# Patient Record
Sex: Male | Born: 1985 | Race: White | Hispanic: No | Marital: Married | Smoking: Never smoker
Health system: Southern US, Community
[De-identification: ages and names within clinical notes are randomized; demographics above are authoritative.]

## PROBLEM LIST (undated history)

## (undated) DIAGNOSIS — I10 Essential (primary) hypertension: Secondary | ICD-10-CM

## (undated) DIAGNOSIS — R112 Nausea with vomiting, unspecified: Secondary | ICD-10-CM

## (undated) DIAGNOSIS — T8859XA Other complications of anesthesia, initial encounter: Secondary | ICD-10-CM

## (undated) DIAGNOSIS — Z9889 Other specified postprocedural states: Secondary | ICD-10-CM

## (undated) DIAGNOSIS — K219 Gastro-esophageal reflux disease without esophagitis: Secondary | ICD-10-CM

## (undated) DIAGNOSIS — R519 Headache, unspecified: Secondary | ICD-10-CM

## (undated) HISTORY — PX: TESTICLE TORSION REDUCTION: SHX795

## (undated) HISTORY — DX: Headache, unspecified: R51.9

## (undated) HISTORY — PX: OTHER SURGICAL HISTORY: SHX169

---
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
## 2012-12-30 ENCOUNTER — Encounter (HOSPITAL_COMMUNITY): Payer: Self-pay | Admitting: *Deleted

## 2012-12-30 ENCOUNTER — Emergency Department (HOSPITAL_COMMUNITY)
Admission: EM | Admit: 2012-12-30 | Discharge: 2012-12-30 | Disposition: A | Discharge status: Home / Self Care | Payer: Worker's Compensation | Attending: Emergency Medicine | Admitting: Emergency Medicine

## 2012-12-30 DIAGNOSIS — S0180XA Unspecified open wound of other part of head, initial encounter: Secondary | ICD-10-CM | POA: Insufficient documentation

## 2012-12-30 DIAGNOSIS — IMO0002 Reserved for concepts with insufficient information to code with codable children: Secondary | ICD-10-CM | POA: Insufficient documentation

## 2012-12-30 DIAGNOSIS — S80211A Abrasion, right knee, initial encounter: Secondary | ICD-10-CM

## 2012-12-30 DIAGNOSIS — S80212A Abrasion, left knee, initial encounter: Secondary | ICD-10-CM

## 2012-12-30 DIAGNOSIS — S0003XA Contusion of scalp, initial encounter: Secondary | ICD-10-CM | POA: Insufficient documentation

## 2012-12-30 DIAGNOSIS — Y9289 Other specified places as the place of occurrence of the external cause: Secondary | ICD-10-CM | POA: Insufficient documentation

## 2012-12-30 DIAGNOSIS — W19XXXA Unspecified fall, initial encounter: Secondary | ICD-10-CM | POA: Insufficient documentation

## 2012-12-30 DIAGNOSIS — R55 Syncope and collapse: Secondary | ICD-10-CM | POA: Insufficient documentation

## 2012-12-30 DIAGNOSIS — S0093XA Contusion of unspecified part of head, initial encounter: Secondary | ICD-10-CM

## 2012-12-30 DIAGNOSIS — S0181XA Laceration without foreign body of other part of head, initial encounter: Secondary | ICD-10-CM

## 2012-12-30 DIAGNOSIS — Y990 Civilian activity done for income or pay: Secondary | ICD-10-CM | POA: Insufficient documentation

## 2012-12-30 DIAGNOSIS — Y9372 Activity, wrestling: Secondary | ICD-10-CM | POA: Insufficient documentation

## 2012-12-30 MED ORDER — LIDOCAINE-EPINEPHRINE-TETRACAINE (LET) SOLUTION
3.0000 mL | Freq: Once | NASAL | Status: DC
  Filled 2012-12-30: qty 3

## 2012-12-30 MED ORDER — ACETAMINOPHEN 500 MG PO TABS
1000.0000 mg | ORAL_TABLET | Freq: Once | ORAL | Status: AC
  Administered 2012-12-30: 1000 mg via ORAL
  Filled 2012-12-30: qty 2
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
## 2016-10-01 ENCOUNTER — Emergency Department (HOSPITAL_COMMUNITY): Payer: No Typology Code available for payment source

## 2016-10-01 ENCOUNTER — Encounter (HOSPITAL_COMMUNITY): Payer: Self-pay | Admitting: Emergency Medicine

## 2016-10-01 ENCOUNTER — Emergency Department (HOSPITAL_COMMUNITY)
Admission: EM | Admit: 2016-10-01 | Discharge: 2016-10-01 | Disposition: A | Discharge status: Home / Self Care | Payer: No Typology Code available for payment source | Attending: Emergency Medicine | Admitting: Emergency Medicine

## 2016-10-01 DIAGNOSIS — M545 Low back pain, unspecified: Secondary | ICD-10-CM

## 2016-10-01 DIAGNOSIS — S3992XA Unspecified injury of lower back, initial encounter: Secondary | ICD-10-CM | POA: Diagnosis present

## 2016-10-01 DIAGNOSIS — I10 Essential (primary) hypertension: Secondary | ICD-10-CM | POA: Insufficient documentation

## 2016-10-01 DIAGNOSIS — Y9389 Activity, other specified: Secondary | ICD-10-CM | POA: Diagnosis not present

## 2016-10-01 DIAGNOSIS — Y92410 Unspecified street and highway as the place of occurrence of the external cause: Secondary | ICD-10-CM | POA: Insufficient documentation

## 2016-10-01 DIAGNOSIS — Y999 Unspecified external cause status: Secondary | ICD-10-CM | POA: Insufficient documentation

## 2016-10-01 HISTORY — DX: Essential (primary) hypertension: I10

## 2016-10-01 HISTORY — DX: Gastro-esophageal reflux disease without esophagitis: K21.9

## 2016-10-01 MED ORDER — CYCLOBENZAPRINE HCL 10 MG PO TABS: 10.0000 mg | ORAL_TABLET | Freq: Two times a day (BID) | ORAL | 0 refills | Status: DC | PRN

## 2016-10-01 MED ORDER — NAPROXEN 500 MG PO TABS: 500.0000 mg | ORAL_TABLET | Freq: Two times a day (BID) | ORAL | 0 refills | Status: DC

## 2016-10-01 NOTE — Discharge Instructions (Signed)
No fracture on x-ray. You will be sore for several days. Alternate cold and hot back. Prescription for muscle relaxer and anti-inflammatory medication.
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
## 2019-12-25 ENCOUNTER — Ambulatory Visit (INDEPENDENT_AMBULATORY_CARE_PROVIDER_SITE_OTHER): Payer: Commercial Managed Care - PPO

## 2019-12-25 ENCOUNTER — Other Ambulatory Visit: Payer: Self-pay

## 2019-12-25 ENCOUNTER — Ambulatory Visit
Admission: EM | Admit: 2019-12-25 | Discharge: 2019-12-25 | Disposition: A | Discharge status: Home / Self Care | Payer: Commercial Managed Care - PPO | Attending: Emergency Medicine | Admitting: Emergency Medicine

## 2019-12-25 DIAGNOSIS — S6992XA Unspecified injury of left wrist, hand and finger(s), initial encounter: Secondary | ICD-10-CM

## 2019-12-25 DIAGNOSIS — M25532 Pain in left wrist: Secondary | ICD-10-CM | POA: Diagnosis not present

## 2019-12-25 MED ORDER — NAPROXEN 500 MG PO TABS: 500.0000 mg | ORAL_TABLET | Freq: Two times a day (BID) | ORAL | 0 refills | Status: DC

## 2019-12-25 NOTE — ED Provider Notes (Signed)
[facility]   [id number] 12/25/19 Arrival Time: 1038  CC: LT wrist PAIN  SUBJECTIVE: History from: patient. Tony Stephens is a 34 y.o. male complains of LT wrist pain and injury x 1 day.  FOOSH.  Localizes the pain to the lateral wrist.  Describes the pain as intermittent and throbbing in character.  Has tried OTC medications without relief.  Symptoms are made worse with ROM.  Reports previous fracture LT forearm.  Complains of associates swelling and slight bruising.  Denies fever, chills, erythema, ecchymosis, numbness and tingling  ROS: As per HPI.  All other pertinent ROS negative.     Past Medical History:  Diagnosis Date  . GERD (gastroesophageal reflux disease)   . Hypertension    Past Surgical History:  Procedure Laterality Date  . arm surgery Left   . TESTICLE TORSION REDUCTION     No Known Allergies No current facility-administered medications on file prior to encounter.   Current Outpatient Medications on File Prior to Encounter  Medication Sig Dispense Refill  . lansoprazole (PREVACID) 30 MG capsule Take 30 mg by mouth daily at 12 noon.     Social History   Socioeconomic History  . Marital status: Single    Spouse name: Not on file  . Number of children: Not on file  . Years of education: Not on file  . Highest education level: Not on file  Occupational History  . Not on file  Tobacco Use  . Smoking status: Never Smoker  . Smokeless tobacco: Never Used  Substance and Sexual Activity  . Alcohol use: No  . Drug use: No  . Sexual activity: Not on file  Other Topics Concern  . Not on file  Social History Narrative  . Not on file   Social Determinants of Health   Financial Resource Strain:   . Difficulty of Paying Living Expenses:   Food Insecurity:   . Worried About Programme researcher, broadcasting/film/video in the Last Year:   . Barista in the Last Year:   Transportation Needs:   . Freight forwarder (Medical):   Marland Kitchen Lack of Transportation  (Non-Medical):   Physical Activity:   . Days of Exercise per Week:   . Minutes of Exercise per Session:   Stress:   . Feeling of Stress :   Social Connections:   . Frequency of Communication with Friends and Family:   . Frequency of Social Gatherings with Friends and Family:   . Attends Religious Services:   . Active Member of Clubs or Organizations:   . Attends Banker Meetings:   Marland Kitchen Marital Status:   Intimate Partner Violence:   . Fear of Current or Ex-Partner:   . Emotionally Abused:   Marland Kitchen Physically Abused:   . Sexually Abused:    Family History  Problem Relation Age of Onset  . Diabetes Father   . Hypertension Father     OBJECTIVE:  Vitals:   12/25/19 1050  BP: (!) 148/84  Pulse: 67  Resp: 18  Temp: 98 F (36.7 C)  SpO2: 97%    General appearance: ALERT; in no acute distress.  Head: NCAT Lungs: Normal respiratory effort CV: Radial pulse 2+ . Cap refill < 2 seconds Musculoskeletal: LT wrist  Inspection: swelling over lateral wrist, light ecchymosis distal dorsal aspect of hand Palpation: TTP over lateral anterior wrist; snuff box tenderness ROM: LROM about the wrist Strength: deferred Skin: warm and dry Neurologic: Ambulates without difficulty; Sensation intact about  the upper extremities Psychological: alert and cooperative; normal mood and affect  DIAGNOSTIC STUDIES:  DG Wrist Complete Left  Result Date: 12/25/2019 CLINICAL DATA:  Left wrist injury last night while playing basketball. EXAM: LEFT WRIST - COMPLETE 3+ VIEW COMPARISON:  6/12/5 FINDINGS: Previous plate and screw fixation of radius and ulna. No acute fractures or dislocations identified. Soft tissues are unremarkable. No significant arthropathy. IMPRESSION: Negative. Electronically Signed   By: Kerby Moors M.D.   On: 12/25/2019 11:14     X-rays negative for bony abnormalities including fracture, or dislocation.  Hardware present  I have reviewed the x-rays myself and the  radiologist interpretation. I am in agreement with the radiologist interpretation.     ASSESSMENT & PLAN:  1. Left wrist pain   2. Injury of left wrist, initial encounter     Meds ordered this encounter  Medications  . naproxen (NAPROSYN) 500 MG tablet    Sig: Take 1 tablet (500 mg total) by mouth 2 (two) times daily.    Dispense:  30 tablet    Refill:  0    Order Specific Question:   Supervising Provider    Answer:   Raylene Everts [[id number]]   X-rays negative for fracture or dislocation However, due to snuff box tenderness, thumb spica brace given Follow up here or with PCP or ortho for repeat x-rays Continue conservative management of rest, ice, and elevation Take naproxen as needed for pain relief (may cause abdominal discomfort, ulcers, and GI bleeds avoid taking with other NSAIDs) Return or go to the ER if you have any new or worsening symptoms (fever, chills, chest pain, redness, swelling, deformity, bruising, worsening symptoms despite treatment, etc...)    Reviewed expectations re: course of current medical issues. Questions answered. Outlined signs and symptoms indicating need for more acute intervention. Patient verbalized understanding. After Visit Summary given.    Lestine Box, PA-C 12/25/19 1120

## 2019-12-25 NOTE — ED Triage Notes (Signed)
Pt injured left wrist last night playing basketball, previous hardware placement

## 2019-12-25 NOTE — Discharge Instructions (Addendum)
X-rays negative for fracture or dislocation However, due to snuff box tenderness, thumb spica brace given Follow up here or with PCP or ortho for repeat x-rays Continue conservative management of rest, ice, and elevation Take naproxen as needed for pain relief (may cause abdominal discomfort, ulcers, and GI bleeds avoid taking with other NSAIDs) Return or go to the ER if you have any new or worsening symptoms (fever, chills, chest pain, redness, swelling, deformity, bruising, worsening symptoms despite treatment, etc...)

## 2021-08-09 ENCOUNTER — Encounter: Payer: Self-pay | Admitting: Psychiatry

## 2021-08-09 ENCOUNTER — Other Ambulatory Visit: Payer: Self-pay

## 2021-08-09 ENCOUNTER — Ambulatory Visit (INDEPENDENT_AMBULATORY_CARE_PROVIDER_SITE_OTHER): Payer: PRIVATE HEALTH INSURANCE | Admitting: Psychiatry

## 2021-08-09 VITALS — BP 135/88 | HR 62 | Ht 68.0 in | Wt 157.0 lb

## 2021-08-09 DIAGNOSIS — G44209 Tension-type headache, unspecified, not intractable: Secondary | ICD-10-CM

## 2021-08-09 MED ORDER — TOPIRAMATE 25 MG PO TABS: ORAL_TABLET | ORAL | 3 refills | Status: DC

## 2021-08-09 MED ORDER — METHOCARBAMOL 500 MG PO TABS: 500.0000 mg | ORAL_TABLET | Freq: Three times a day (TID) | ORAL | 0 refills | Status: DC | PRN

## 2021-08-09 MED ORDER — METHYLPREDNISOLONE 4 MG PO TBPK: ORAL_TABLET | ORAL | 0 refills | Status: DC

## 2021-08-09 NOTE — Patient Instructions (Addendum)
Increase Topamax to 25 in the morning and 50 mg at night for one week. Then increase to 50 mg in the morning and 50 mg at night. Steroid pack to help break current headache Take Robaxin up to three times a day as needed for neck tension and pain

## 2021-08-09 NOTE — Progress Notes (Signed)
Referring:  Shawnie Dapper, PA-C [address] [place],  [place] [address]  PCP: Hart Robinsons, DO  Neurology was asked to evaluate Tony Stephens, a 36 year old male for a chief complaint of headaches.  Our recommendations of care will be communicated by shared medical record.    CC:  headaches  HPI:  Medical co-morbidities: none  The patient presents for evaluation of headaches which began 5 weeks ago. He does not have a history of headaches prior to this. Saw his PCP who treated him for possible sinus infection. He did not have any improvement in his headaches with antibiotics. Headaches occur daily and are triggered by standing for prolonged periods of time, driving for a [place] time, or exerting himself. They feels like a band squeezing around his head. No photophobia or phonophobia, but will have occasional nausea. They will sometimes last for the entire day.  He was started on Topamax 25 mg BID 2 weeks ago. Has not noticed much of a difference yet. He does have paresthesias in bilateral feet since starting it, but does not find them particularly bothersome. Takes xanax at night which does seem to relieve some of the pressure.  Headache History: Onset: 5 weeks ago Triggers: exertion, standing for a [place] time, driving for a [place] time Aura: none Location: bilateral temples Quality/Description: pressure Associated Symptoms:  Photophobia: no  Phonophobia: no  Nausea: yes  Tinnitus Worse with activity?: yes Duration of headaches: can last all day  Headache days per month: 30 Headache free days per month: 0  Current Treatment: Abortive none  Preventative Topamax 25 mg BID  Prior Therapies                                 Topamax 25 mg BID Excedrin BC powder   Headache Risk Factors: Headache risk factors and/or co-morbidities (+) Neck Pain - tension (-) History of Traumatic Brain Injury and/or Concussion  LABS: none   IMAGING:  [facility] 07/28/21:  unremarkable   Current Outpatient Medications on File Prior to Visit  Medication Sig Dispense Refill   ALPRAZolam (XANAX) 0.5 MG tablet Take 0.5 mg by mouth 3 (three) times daily as needed.     omeprazole (PRILOSEC) 20 MG capsule Take 20 mg by mouth daily.     topiramate (TOPAMAX) 25 MG tablet Take 25 mg by mouth 2 (two) times daily.     naproxen (NAPROSYN) 500 MG tablet Take 1 tablet (500 mg total) by mouth 2 (two) times daily. (Patient not taking: Reported on 08/09/2021) 30 tablet 0   No current facility-administered medications on file prior to visit.     Allergies: No Known Allergies  Family History: Migraine or other headaches in the family:  yes Aneurysms in a first degree relative:  none Brain tumors in the family:  none Other neurological illness in the family:   none  Past Medical History: Past Medical History:  Diagnosis Date   GERD (gastroesophageal reflux disease)    Headache    Hypertension     Past Surgical History Past Surgical History:  Procedure Laterality Date   arm surgery Left    TESTICLE TORSION REDUCTION      Social History: Social History   Tobacco Use   Smoking status: Never   Smokeless tobacco: Never  Substance Use Topics   Alcohol use: No   Drug use: No    ROS: Negative for fevers,  chills. Positive for headaches. All other systems reviewed and negative unless stated otherwise in HPI.   Physical Exam:   Vital Signs: BP 135/88    Pulse 62    Ht 5\' 8"  (1.727 m)    Wt 157 lb (71.2 kg)    BMI 23.87 kg/m  GENERAL: well appearing,in no acute distress,alert SKIN:  Color, texture, turgor normal. No rashes or lesions HEAD:  Normocephalic/atraumatic. CV:  RRR RESP: Normal respiratory effort MSK: no tenderness to palpation over occiput, neck, or shoulders  NEUROLOGICAL: Mental Status: Alert, oriented to person, place and time,Follows commands Cranial Nerves: PERRL,no papilledema visualized, visual fields intact to  confrontation,extraocular movements intact,facial sensation intact,no facial droop or ptosis,hearing grossly intact,no dysarthria Motor: muscle strength 5/5 both upper and lower extremities,no drift, normal tone Reflexes: 2+ throughout Sensation: intact to light touch all 4 extremities Coordination: Finger-to- nose-finger intact bilaterally Gait: normal-based   IMPRESSION: 36 year old male who presents for evaluation of headaches which began 5 weeks ago. CTH last month was unremarkable. His current headache pattern is consistent for tension-type headache. He was just started on Topamax 2 weeks ago and has not noticed improvement yet. Will increase dose of Topamax and continue for at least a few more weeks to give it time to take effect. Medrol dosepak prescribed to help reduce his headache while waiting for Topamax to take effect. Will also start Robaxin as needed to help with neck tension.  PLAN: -Medrol dosepak -Prevention: Increase Topamax to 25/50 x1 week, then increase to 50 mg BID -Rescue: Robaxin 500 mg PRN for neck tension -next steps: consider SNRI, TCA for headache prevention. Consider MRI if headaches worsen despite treatment    I spent a total of 34 minutes chart reviewing and counseling the patient. Headache education was done. Discussed treatment options including preventive and acute medications. Discussed medication side effects, adverse reactions and drug interactions. Written educational materials and patient instructions outlining all of the above were given.  Follow-up: 3 months   20, MD 08/09/2021   2:40 PM

## 2021-08-16 ENCOUNTER — Encounter: Payer: Self-pay | Admitting: Psychiatry

## 2021-08-17 NOTE — Telephone Encounter (Signed)
[facility] pharmacy, spoke with Diane who stated they have 08/09/21 Rx but patient filled it on 08/01/21, 1 tab twice daily, Rx from PCP. She was unable to fill new Rx on 08/09/21. She stated she can refill today. I advised I'll let patient know.

## 2021-09-04 ENCOUNTER — Encounter: Payer: Self-pay | Admitting: Psychiatry

## 2021-09-05 ENCOUNTER — Ambulatory Visit (HOSPITAL_COMMUNITY)
Admission: RE | Admit: 2021-09-05 | Discharge: 2021-09-05 | Disposition: A | Discharge status: Home / Self Care | Payer: PRIVATE HEALTH INSURANCE | Source: Ambulatory Visit | Attending: Physician Assistant | Admitting: Physician Assistant

## 2021-09-05 ENCOUNTER — Other Ambulatory Visit: Payer: Self-pay

## 2021-09-05 ENCOUNTER — Other Ambulatory Visit (HOSPITAL_COMMUNITY): Payer: Self-pay | Admitting: Physician Assistant

## 2021-09-05 DIAGNOSIS — R053 Chronic cough: Secondary | ICD-10-CM

## 2021-09-05 DIAGNOSIS — R0602 Shortness of breath: Secondary | ICD-10-CM | POA: Diagnosis present

## 2021-09-05 MED ORDER — TOPIRAMATE 25 MG PO TABS: ORAL_TABLET | ORAL | 3 refills | Status: DC

## 2021-09-05 NOTE — Telephone Encounter (Signed)
Please advise patient to increase Topamax to 2 pills in the morning and 3 at bedtime for total dose of 125 mg daily.  I have adjusted his prescription and sent it to his pharmacy. ?

## 2021-09-11 ENCOUNTER — Other Ambulatory Visit: Payer: Self-pay | Admitting: Psychiatry

## 2021-09-11 ENCOUNTER — Telehealth: Payer: Self-pay | Admitting: Psychiatry

## 2021-09-11 DIAGNOSIS — R519 Headache, unspecified: Secondary | ICD-10-CM

## 2021-09-11 MED ORDER — METHOCARBAMOL 500 MG PO TABS: 500.0000 mg | ORAL_TABLET | Freq: Three times a day (TID) | ORAL | 3 refills | Status: DC | PRN

## 2021-09-11 NOTE — Telephone Encounter (Signed)
[employer] order sent to [facility], they will obtain the auth and reach out to the patient to schedule.  °

## 2021-09-11 NOTE — Telephone Encounter (Signed)
I ordered an MRI brain for him. I would give the Topamax a few more weeks at the new dose before switching to a new medication. In the meantime he can start taking Robaxin every night at bedtime and see if that helps. I sent a refill to his pharmacy

## 2021-09-13 ENCOUNTER — Encounter: Payer: Self-pay | Admitting: Internal Medicine

## 2021-09-19 ENCOUNTER — Ambulatory Visit
Admission: RE | Admit: 2021-09-19 | Discharge: 2021-09-19 | Disposition: A | Discharge status: Home / Self Care | Payer: PRIVATE HEALTH INSURANCE | Source: Ambulatory Visit | Attending: Psychiatry | Admitting: Psychiatry

## 2021-09-19 DIAGNOSIS — R519 Headache, unspecified: Secondary | ICD-10-CM

## 2021-09-19 MED ORDER — GADOBENATE DIMEGLUMINE 529 MG/ML IV SOLN
14.0000 mL | Freq: Once | INTRAVENOUS | Status: AC | PRN
  Administered 2021-09-19: 14 mL via INTRAVENOUS

## 2021-10-10 ENCOUNTER — Other Ambulatory Visit: Payer: Self-pay | Admitting: Psychiatry

## 2021-10-10 ENCOUNTER — Telehealth: Payer: Self-pay | Admitting: Psychiatry

## 2021-10-10 DIAGNOSIS — M5412 Radiculopathy, cervical region: Secondary | ICD-10-CM

## 2021-10-10 DIAGNOSIS — M542 Cervicalgia: Secondary | ICD-10-CM

## 2021-10-10 NOTE — Telephone Encounter (Signed)
[employer] order sent to [facility], they will obtain the auth and reach out to the patient to schedule.  ?

## 2021-10-12 ENCOUNTER — Ambulatory Visit (HOSPITAL_COMMUNITY)
Admission: RE | Admit: 2021-10-12 | Discharge: 2021-10-12 | Disposition: A | Discharge status: Home / Self Care | Payer: PRIVATE HEALTH INSURANCE | Source: Ambulatory Visit | Attending: Physician Assistant | Admitting: Physician Assistant

## 2021-10-12 ENCOUNTER — Other Ambulatory Visit (HOSPITAL_COMMUNITY): Payer: Self-pay | Admitting: Physician Assistant

## 2021-10-12 DIAGNOSIS — M546 Pain in thoracic spine: Secondary | ICD-10-CM | POA: Insufficient documentation

## 2021-10-12 DIAGNOSIS — M545 Low back pain, unspecified: Secondary | ICD-10-CM | POA: Insufficient documentation

## 2021-10-18 ENCOUNTER — Other Ambulatory Visit: Payer: PRIVATE HEALTH INSURANCE

## 2021-10-20 ENCOUNTER — Ambulatory Visit: Payer: PRIVATE HEALTH INSURANCE | Admitting: Gastroenterology

## 2021-10-21 ENCOUNTER — Other Ambulatory Visit: Payer: PRIVATE HEALTH INSURANCE

## 2021-10-30 ENCOUNTER — Encounter: Payer: Self-pay | Admitting: Psychiatry

## 2021-10-30 NOTE — Telephone Encounter (Signed)
Taron with [facility] sent me a message stating:  ? ?This one was actually denied and they need add'l clinicals or a p2p can be done. The contact person is Randa Evens [phone] option [phone] and her extension [phone]. ?

## 2021-10-31 NOTE — Telephone Encounter (Signed)
What was the reason for denial? Is there any more info I need to provide for them?

## 2021-11-01 ENCOUNTER — Encounter: Payer: Self-pay | Admitting: Psychiatry

## 2021-11-01 NOTE — Telephone Encounter (Signed)
I sent Taron with [facility] a message asking her if [employer] gave her any reason for the denial since she is the one that did this PA. ?

## 2021-11-02 NOTE — Telephone Encounter (Signed)
Taron with [employer] me back stating: ? ?"Tony Stephens couldn't approve it off of the clinicals date 08/09/2021 w/ Dr Delena Bali and the Medical Director either so she called me back and requested add'l clinicals or a p2p but I got that call on Friday and you all was closed I didn't see any other medical notes for 2023 other than the ones on 08/09/21 and a few telephone calls but nothing else" ?

## 2021-11-02 NOTE — Telephone Encounter (Signed)
I am waiting on Taron with [facility] to get back to me on this.  ?

## 2021-11-02 NOTE — Telephone Encounter (Signed)
Tony Stephens sent me a message stating:  ? ?"Tony Stephens couldn't approve it off of the clinicals date 08/09/2021 w/ Dr Billey Gosling and the Medical Director either so she called me back and requested add'l clinicals or a p2p but I got that call on Friday and you all was closed. I didn't see any other medical notes for 2023 other than the ones on 08/09/21 and a few telephone calls but nothing else" ?

## 2021-11-06 ENCOUNTER — Telehealth: Payer: Self-pay | Admitting: *Deleted

## 2021-11-06 NOTE — Telephone Encounter (Signed)
This is what Taron with [facility] told me: ? ?The pending case #[id number], Janus Molder is the Nurse Reviewer, call [phone] and whomever answer will be able to pull the authorization information and get you all to the correct person. ?

## 2021-11-06 NOTE — Telephone Encounter (Signed)
Patient's MRI cervical spine denied. Received this message: ?The pending case #[id number], Janus Molder is the Nurse Reviewer, call [phone].  ?[place] answered call and transferred to [employer], p# [phone]. Spoke with Bonita Quin who transferred call to [place]. Denied due to insufficient clinical information. Peer to peer can be done or additional information can be sent. She will fax denial letter for Dr Delena Bali to review.  ?Denial received, placed on MD desk.   ?

## 2021-11-06 NOTE — Telephone Encounter (Signed)
Can we please call this number and see what info they need? Thank you!

## 2021-11-06 NOTE — Telephone Encounter (Signed)
I'm still not sure what information they need from [place]. Did the office receive a denial letter? It sounds like the patient didn't receive one

## 2021-11-09 ENCOUNTER — Ambulatory Visit (HOSPITAL_COMMUNITY): Payer: PRIVATE HEALTH INSURANCE | Attending: Psychiatry

## 2021-11-09 DIAGNOSIS — G4486 Cervicogenic headache: Secondary | ICD-10-CM | POA: Insufficient documentation

## 2021-11-09 DIAGNOSIS — M542 Cervicalgia: Secondary | ICD-10-CM | POA: Diagnosis present

## 2021-11-09 NOTE — Therapy (Addendum)
OUTPATIENT PHYSICAL THERAPY CERVICAL EVALUATION   Patient Name: Tony Stephens MRN: [id number] DOB:February 16, 1986, 36 y.o., male Today's Date: 11/09/2021   PT End of Session - 11/09/21 1645     Visit Number 1    Number of Visits 6    Date for PT Re-Evaluation 11/30/21    Authorization Type [employer], 60 visit limit, no co-pay, no auth    Authorization - Visit Number 1    Authorization - Number of Visits 60    Progress Note Due on Visit 6    PT Start Time 1640    PT Stop Time 1725    PT Time Calculation (min) 45 min             Past Medical History:  Diagnosis Date   GERD (gastroesophageal reflux disease)    Headache    Hypertension    Past Surgical History:  Procedure Laterality Date   arm surgery Left    TESTICLE TORSION REDUCTION     There are no problems to display for this patient.   PCP: [facility]  REFERRING PROVIDER: Ocie Doyne, MD  REFERRING DIAG: M54.2 (ICD-10-CM) - Cervicalgia   THERAPY DIAG:  Neck pain  Cervicogenic headache  ONSET DATE: Jan 2023  SUBJECTIVE:                                                                                                                                                                                                         SUBJECTIVE STATEMENT: Goes by "Will"; starting having really bad headaches end of year into Jan of this year; saw Dr. Delena Bali.  Gave him muscle relaxers; referred him to therapy.   PERTINENT HISTORY:  Hx of low and mid back pain as well.  No surgical hx or injuries  PAIN:  Are you having pain? Yes: NPRS scale: 5/10 Pain location: neck, head, shoulders and sometimes chest Pain description: stiffness, headaches Aggravating factors: worse as the day progresses Relieving factors: ice, medication  PRECAUTIONS: None  WEIGHT BEARING RESTRICTIONS No  FALLS:  Has patient fallen in last 6 months? No  OCCUPATION: Emergency planning/management officer  PLOF: Independent  PATIENT GOALS  have less pain and  stiffness in neck  OBJECTIVE:   DIAGNOSTIC FINDINGS:  CLINICAL DATA:  Low back pain; unspecified back pain laterality, unspecified chronicity, unspecified whether sciatica present. Lower back pain for 1 month.   EXAM: LUMBAR SPINE - 2-3 VIEW   COMPARISON:  X-ray lumbar 10/01/2016.   FINDINGS: There is no evidence of lumbar spine fracture. Alignment is normal. Mild degenerative disc disease is noted  at L5-S1.   IMPRESSION: Mild degenerative disc disease is noted at L5-S1. No acute abnormality is seen. CLINICAL DATA:  Thoracic back pain for 1 month.   EXAM: THORACIC SPINE - 3 VIEWS   COMPARISON:  None.   FINDINGS: There is no evidence of thoracic spine fracture. Alignment is normal. No other significant bone abnormalities are identified.   IMPRESSION: Negative.  [facility]   NEUROIMAGING REPORT     STUDY DATE: 09/19/21 PATIENT NAME: Tony Stephens DOB: 1986-05-25 MRN: [id number]   ORDERING CLINICIAN: Ocie Doyne, MD  CLINICAL HISTORY: 36 year old male with headaches.   EXAM: MR BRAIN W WO CONTRAST  TECHNIQUE: MRI of the brain with and without contrast was obtained utilizing multiplanar, multiecho pulse sequences. CONTRAST: 14ml multihance  COMPARISON: 07/28/21   IMAGING SITE: [facility]        FINDINGS:    No abnormal lesions are seen on diffusion-weighted views to suggest acute ischemia. The cortical sulci, fissures and cisterns are normal in size and appearance. Lateral, third and fourth ventricle are normal in size and appearance. No extra-axial fluid collections are seen. No evidence of mass effect or midline shift.  Few punctate foci of nonspecific T2 hyperintensities in the subcortical white matter.  No abnormal lesions are seen on post contrast views.     On sagittal views the posterior fossa, pituitary gland and corpus callosum are unremarkable. No evidence of intracranial hemorrhage on SWI  views. The orbits and their contents, paranasal sinuses and calvarium are unremarkable.  Intracranial flow voids are present.     IMPRESSION:    Unremarkable MRI brain (with and without). No acute findings.   PATIENT SURVEYS:  FOTO 58   COGNITION: Overall cognitive status: Within functional limits for tasks assessed   SENSATION: WFL  POSTURE:  Slight forward head and rounded shoulders  PALPATION: Mild cervical spine tenderness   CERVICAL ROM:   Active ROM A/PROM (deg) 11/09/2021  Flexion wfl  Extension Limited by 20%  Right lateral flexion Limited by 20%  Left lateral flexion Limited by 20%  Right rotation Limted by 20%   Left rotation Limited by 20%   (Blank rows = not tested)  UE MMT:  MMT Right 11/09/2021 Left 11/09/2021  Shoulder flexion 5 5  Shoulder extension    Shoulder abduction 5 5  Shoulder adduction    Shoulder extension    Shoulder internal rotation 5 5  Shoulder external rotation 4 4  Middle trapezius    Lower trapezius    Elbow flexion 5 5  Elbow extension 4+ 4+  Wrist flexion    Wrist extension    Wrist ulnar deviation    Wrist radial deviation    Wrist pronation    Wrist supination    Grip strength     (Blank rows = not tested)  CERVICAL SPECIAL TESTS:  Distraction test: Positive decreased pain with distraction    PATIENT SURVEYS:  FOTO 58  TODAY'S TREATMENT:  Physical therapy evaluation and HEP instruction, postural education Manual traction 5" hold x 10 in supine   PATIENT EDUCATION:  Education details: plan of care, HEP instruction and postural education  Person educated: Patient Education method: Explanation, Demonstration, and Handouts Education comprehension: verbalized understanding, returned demonstration, and needs further education   HOME EXERCISE PROGRAM: Access Code: [email or web address] URL: [email or web address] Date: 11/09/2021 Prepared by: [facility]  Exercises - Seated Cervical Retraction  - 1 x  daily - 7 x weekly - 1 sets -  10 reps - Supine Chin Tuck  - 1 x daily - 7 x weekly - 1 sets - 10 reps - Supine Passive Cervical Retraction  - 1 x daily - 7 x weekly - 1 sets - 10 reps  ASSESSMENT:  CLINICAL IMPRESSION: Patient is a 36 y.o. male who was seen today for physical therapy evaluation and treatment for neck pain and headaches. Patient presents with pain and cervical AROM limitations that negatively effect his daily activities including but not limited to his ability to scan for driving and his work as a Emergency planning/management officer. Patient will benefit from skilled PT interventions to address neck pain and headaches, decreased cervical mobility, postural education and the instruction, development and modification of HEP.   OBJECTIVE IMPAIRMENTS decreased activity tolerance, decreased endurance, decreased knowledge of condition, decreased mobility, decreased ROM, hypomobility, increased fascial restrictions, postural dysfunction, and pain.   ACTIVITY LIMITATIONS cleaning, community activity, driving, meal prep, occupation, laundry, medication management, yard work, and shopping.   PERSONAL FACTORS Profession are also affecting patient's functional outcome.    REHAB POTENTIAL: Good  CLINICAL DECISION MAKING: Stable/uncomplicated  EVALUATION COMPLEXITY: Low   GOALS: Goals reviewed with patient? No  SHORT TERM GOALS: Target date: 11/16/2021     patient will be independent with initial HEP Baseline:  Goal status: INITIAL  [place] TERM GOALS: Target date: 11/30/2021 Patient will be independent with advanced HEP and self management strategies to improve quality of life and functional outcomes.  Baseline:  Goal status: INITIAL  2.  Patient will be able to demonstrate pain free cervical motion in all tested motions/directions (flexion/extension/SB R/SB L/ ROT R/ ROT L)  Baseline: pain with all movements Goal status: INITIAL  3.  Patient will work all day without headache pain greater than  2/10 to improve work efficiency Baseline: 4/10 Goal status: INITIAL  4.   Patient will improve FOTO score to predicted value to demonstrate improved functional mobility.  Baseline: 58 Goal status: INITIAL  5.  Patient will report at least 75% improvement in overall symptoms and/or function to demonstrate improved functional mobility   Baseline:  Goal status: INITIAL   PLAN: PT FREQUENCY: 2x/week  PT DURATION: 3 weeks  PLANNED INTERVENTIONS: Therapeutic exercises, Therapeutic activity, Neuromuscular re-education, Balance training, Gait training, Patient/Family education, Joint manipulation, Joint mobilization, Stair training, Vestibular training, Canalith repositioning, DME instructions, Aquatic Therapy, Dry Needling, Electrical stimulation, Spinal manipulation, Spinal mobilization, Cryotherapy, Moist heat, Manual lymph drainage, scar mobilization, Splintting, Taping, Traction, Ultrasound, and Manual therapy  PLAN FOR NEXT SESSION: Review goals and HEP, man and STW as appropriate; progress postural exercise as able.    5:33 PM, 11/09/21 Daelon Dunivan Small Jheri Mitter MPT [facility] [place] [phone]

## 2021-11-13 ENCOUNTER — Encounter (HOSPITAL_COMMUNITY): Payer: Self-pay

## 2021-11-13 ENCOUNTER — Ambulatory Visit (HOSPITAL_COMMUNITY): Payer: PRIVATE HEALTH INSURANCE

## 2021-11-13 DIAGNOSIS — M542 Cervicalgia: Secondary | ICD-10-CM

## 2021-11-13 DIAGNOSIS — G4486 Cervicogenic headache: Secondary | ICD-10-CM

## 2021-11-13 NOTE — Therapy (Signed)
?OUTPATIENT PHYSICAL THERAPY CERVICAL EVALUATION ? ? ?Patient Name: Tony Stephens ?MRN: [id number] ?DOB:04-10-1986, 36 y.o., male ?Today's Date: 11/13/2021 ? ? PT End of Session - 11/13/21 1520   ? ? Visit Number 2   ? Number of Visits 6   ? Date for PT Re-Evaluation 11/30/21   ? Authorization Type [employer], 60 visit limit, no co-pay, no auth   ? Authorization - Visit Number 1   ? Authorization - Number of Visits 60   ? Progress Note Due on Visit 6   ? PT Start Time 1520   ? PT Stop Time 1600   ? PT Time Calculation (min) 40 min   ? ?  ?  ? ?  ? ? ?Past Medical History:  ?Diagnosis Date  ? GERD (gastroesophageal reflux disease)   ? Headache   ? Hypertension   ? ?Past Surgical History:  ?Procedure Laterality Date  ? arm surgery Left   ? TESTICLE TORSION REDUCTION    ? ?There are no problems to display for this patient. ? ? ?PCP: [facility] ? ?REFERRING PROVIDER: Ocie Doyne, MD ? ?REFERRING DIAG: M54.2 (ICD-10-CM) - Cervicalgia  ? ?THERAPY DIAG:  ?Neck pain ? ?Cervicogenic headache ? ?ONSET DATE: Jan 2023 ? ?SUBJECTIVE:                                                                                                                                                                                                        ? ?SUBJECTIVE STATEMENT: ?Patient has had headache every day since Jan. Headaches can come and go throughout day but gradually build up to become worse as days goes on.  ? ?PERTINENT HISTORY:  ?Hx of low and mid back pain as well.  No surgical hx or injuries ? ?PAIN:  ?Are you having pain? Yes: NPRS scale: 5/10 ?Pain location: neck, head, shoulders and sometimes chest ?Pain description: stiffness, headaches ?Aggravating factors: worse as the day progresses ?Relieving factors: ice, medication ? ?PRECAUTIONS: None ? ?WEIGHT BEARING RESTRICTIONS No ? ?FALLS:  ?Has patient fallen in last 6 months? No ? ?OCCUPATION: Emergency planning/management officer ? ?PLOF: Independent ? ?PATIENT GOALS  have less pain and stiffness in  neck ? ?OBJECTIVE:  ? ?DIAGNOSTIC FINDINGS:  ?CLINICAL DATA:  Low back pain; unspecified back pain laterality, ?unspecified chronicity, unspecified whether sciatica present. Lower ?back pain for 1 month. ?  ?EXAM: ?LUMBAR SPINE - 2-3 VIEW ?  ?COMPARISON:  X-ray lumbar 10/01/2016. ?  ?FINDINGS: ?There is no evidence of lumbar spine fracture. Alignment is normal. ?Mild degenerative disc disease is noted at L5-S1. ?  ?  IMPRESSION: ?Mild degenerative disc disease is noted at L5-S1. No acute ?abnormality is seen. ?CLINICAL DATA:  Thoracic back pain for 1 month. ?  ?EXAM: ?THORACIC SPINE - 3 VIEWS ?  ?COMPARISON:  None. ?  ?FINDINGS: ?There is no evidence of thoracic spine fracture. Alignment is ?normal. No other significant bone abnormalities are identified. ?  ?IMPRESSION: ?Negative. ? ?[facility] ?  ?NEUROIMAGING REPORT ?  ?  ?STUDY DATE: 09/19/21 ?PATIENT NAME: Tony Stephens ?DOB: 1985-09-01 ?MRN: [id number] ?  ?ORDERING CLINICIAN: Ocie Doynehima, Jennifer, MD  ?CLINICAL HISTORY: 36 year old male with headaches. ?  ?EXAM: MR BRAIN W WO CONTRAST  ?TECHNIQUE: MRI of the brain with and without contrast was obtained utilizing multiplanar, multiecho pulse sequences. ?CONTRAST: 14ml multihance  ?COMPARISON: 07/28/21 ?  ?IMAGING SITE: ?[facility] ?[place] ?  ?  ?  ?FINDINGS:  ?  ?No abnormal lesions are seen on diffusion-weighted views to suggest acute ischemia. The cortical sulci, fissures and cisterns are normal in size and appearance. Lateral, third and fourth ventricle are normal in size and appearance. No extra-axial fluid collections are seen. No evidence of mass effect or midline shift.  Few punctate foci of nonspecific T2 hyperintensities in the subcortical white matter.  No abnormal lesions are seen on post contrast views.   ?  ?On sagittal views the posterior fossa, pituitary gland and corpus callosum are unremarkable. No evidence of intracranial hemorrhage on SWI views. The orbits  and their contents, paranasal sinuses and calvarium are unremarkable.  Intracranial flow voids are present. ?  ?  ?IMPRESSION:  ?  ?Unremarkable MRI brain (with and without). No acute findings. ? ? ?PATIENT SURVEYS:  ?FOTO 1558 ? ? ?COGNITION: ?Overall cognitive status: Within functional limits for tasks assessed ? ? ?SENSATION: ?WFL ? ?POSTURE:  ?Slight forward head and rounded shoulders ? ?PALPATION: ?Mild cervical spine tenderness  ? ?CERVICAL ROM:  ? ?Active ROM A/PROM (deg) ?11/09/2021  ?Flexion wfl  ?Extension Limited by 20%  ?Right lateral flexion Limited by 20%  ?Left lateral flexion Limited by 20%  ?Right rotation Limted by 20%   ?Left rotation Limited by 20%  ? (Blank rows = not tested) ? ?UE MMT: ? ?MMT Right ?11/09/2021 Left ?11/09/2021  ?Shoulder flexion 5 5  ?Shoulder extension    ?Shoulder abduction 5 5  ?Shoulder adduction    ?Shoulder extension    ?Shoulder internal rotation 5 5  ?Shoulder external rotation 4 4  ?Middle trapezius    ?Lower trapezius    ?Elbow flexion 5 5  ?Elbow extension 4+ 4+  ?Wrist flexion    ?Wrist extension    ?Wrist ulnar deviation    ?Wrist radial deviation    ?Wrist pronation    ?Wrist supination    ?Grip strength    ? (Blank rows = not tested) ? ?CERVICAL SPECIAL TESTS:  ?Distraction test: Positive decreased pain with distraction ? ? ? ?PATIENT SURVEYS:  ?FOTO [id number] ? ?TODAY'S TREATMENT:  ? ?11/13/21 ?Upper trap and levator seated stretch x3 each b/l 10 sec holds ?STM occipital/cervical and upper trap area x15 mins ?Cervical manual distraction x3 mins ?Self SNAGS for Extension and rotation x6 each ?Supine chin tuck with elevation x10 ?Quadruped chin tuck with neck flexion and extension x5 ?Quadruped scap plus 1 with neck extension with green tband x10 ? ? ? ?Physical therapy evaluation and HEP instruction, postural education ?Manual traction 5" hold x 10 in supine ? ? ?PATIENT EDUCATION:  ?Education details: updated HEP ?Person educated:  Patient ?Education method: Explanation,  Demonstration, and Handouts ?Education comprehension: verbalized understanding, returned demonstration, and needs further education ? ? ?HOME EXERCISE PROGRAM: ?Access Code: [email or web address] ?URL: [email or web address] ?Date: 11/09/2021 ?Prepared by: [facility] ? ?Exercises ?- Seated Cervical Retraction  - 1 x daily - 7 x weekly - 1 sets - 10 reps ?- Supine Chin Tuck  - 1 x daily - 7 x weekly - 1 sets - 10 reps ?- Supine Passive Cervical Retraction  - 1 x daily - 7 x weekly - 1 sets - 10 reps ? ?Access Code: [email or web address] ?URL: [email or web address] ?Date: 11/13/2021 ?Prepared by: Aleatha Borer ? ?Exercises ?- Seated Upper Trapezius Stretch  - 2 x daily - 7 x weekly - 1 sets - 10 reps - 10 hold ?- Seated Levator Scapulae Stretch  - 2 x daily - 7 x weekly - 1 sets - 10 reps - 10 hold ?- Quadruped Cervical Flexion and Extension  - 1 x daily - 7 x weekly - 3 sets - 10 reps - 2 hold ?-Quadruped scap plus 1 with resisted neck extensions 1 daily- 7x weekly - 3 sets- 10 reps - 2hold ? ?ASSESSMENT: ? ?CLINICAL IMPRESSION: ?Patient with minor improvement in decrease of headache post session. Pt with difficulty chin tucking and creating combined neck flexion and extension motion. Pt with difficulty combining breath into exercises. Pt will continue to benefit from skilled PT services to decrease reports of headaches.  ? ? ?OBJECTIVE IMPAIRMENTS decreased activity tolerance, decreased endurance, decreased knowledge of condition, decreased mobility, decreased ROM, hypomobility, increased fascial restrictions, postural dysfunction, and pain.  ? ?ACTIVITY LIMITATIONS cleaning, community activity, driving, meal prep, occupation, laundry, medication management, yard work, and shopping.  ? ?PERSONAL FACTORS Profession are also affecting patient's functional outcome.  ? ? ?REHAB POTENTIAL: Good ? ?CLINICAL DECISION MAKING: Stable/uncomplicated ? ?EVALUATION COMPLEXITY: Low ? ? ?GOALS: ?Goals reviewed with patient?  No ? ?SHORT TERM GOALS: Target date: 11/16/2021   ? ? patient will be independent with initial HEP ?Baseline:  ?Goal status: INITIAL ? ?[place] TERM GOALS: Target date: 11/30/2021 ?Patient will be independent with adva

## 2021-11-16 ENCOUNTER — Encounter: Payer: Self-pay | Admitting: Psychiatry

## 2021-11-16 ENCOUNTER — Ambulatory Visit (INDEPENDENT_AMBULATORY_CARE_PROVIDER_SITE_OTHER): Payer: PRIVATE HEALTH INSURANCE | Admitting: Psychiatry

## 2021-11-16 VITALS — BP 134/85 | HR 49 | Ht 68.0 in | Wt 167.0 lb

## 2021-11-16 DIAGNOSIS — G44229 Chronic tension-type headache, not intractable: Secondary | ICD-10-CM

## 2021-11-16 MED ORDER — METHOCARBAMOL 500 MG PO TABS: 500.0000 mg | ORAL_TABLET | Freq: Three times a day (TID) | ORAL | 3 refills | Status: DC | PRN

## 2021-11-16 MED ORDER — AMITRIPTYLINE HCL 25 MG PO TABS: ORAL_TABLET | ORAL | 3 refills | Status: DC

## 2021-11-16 NOTE — Patient Instructions (Signed)
Decrease Topamax to 2 pills in the morning and 2 pills at bedtime for one week, then to 2 pills at bedtime for one week, then stop it Start amitriptyline for headache prevention. Take 1/2 pill at bedtime for one week, then increase to 1 pill at bedtime

## 2021-11-16 NOTE — Progress Notes (Signed)
   CC:  headaches  Follow-up Visit  Last visit: 08/09/21  Brief HPI: 36 year old male who follows in clinic for daily tension-type headaches. [facility] 07/28/21 was unremarkable.  At his last visit he was given a medrol dosepak to help break headache cycle. Topamax dose was increase to 100 mg QHS. He was given robaxin PRN for neck tension.  Interval History: Headaches did not improve with Topamax so brain MRI was ordered. This was unremarkable. He continues to take Topamax but still hasn't noticed much of a difference in his headaches. Taking the Robaxin does help reduce headache severity, but he continues to have headaches daily. They are not associated with photophobia or phonophobia.  He also developed shooting pains in his neck and numbness in his hands. He was referred to neck PT and attended his first session a few days ago. Thinks this was helpful but it is still too early to tell.   Headache days per month: 30 Headache free days per month: 0  Current Headache Regimen: Preventative: Topamax 50/75 Abortive: Robaxin 500 mg PRN  Prior Therapies                                  Topamax 50/75 - lack of efficacy Robaxin 500 mg PRN Excedrin BC powder  Physical Exam:   Vital Signs: BP 134/85   Pulse (!) 49   Ht 5\' 8"  (1.727 m)   Wt 167 lb (75.8 kg)   BMI 25.39 kg/m  GENERAL:  well appearing, in no acute distress, alert  SKIN:  Color, texture, turgor normal. No rashes or lesions HEAD:  Normocephalic/atraumatic. RESP: normal respiratory effort MSK:  No gross joint deformities.   NEUROLOGICAL: Mental Status: Alert, oriented to person, place and time, Follows commands, and Speech fluent and appropriate. Cranial Nerves: PERRL, face symmetric, no dysarthria, hearing grossly intact Motor: moves all extremities equally Gait: normal-based.  IMPRESSION: 36 year old male who presents for follow up of chronic tension-type headaches. Robaxin does seem to help reduce his headache  severity. He has just started neck PT which seems to be helpful. Topamax has not made a difference in his headaches. Will switch to amitriptyline for headache prevention.  PLAN: -Prevention: Wean off of Topamax. Start amitriptyline 12.5 mg QHS x1 week, then increase to 25 mg QHS -Rescue: Continue Robaxin 500 mg PRN -Continue neck PT -next steps: Consider MRI C-spine if no improvement in hand paresthesias/numbness with neck PT. Consider SNRI, gabapentin for headache prevention  Follow-up: 4 months  I spent a total of 27 minutes on the date of the service. Headache education was done. Discussed treatment options including preventive and acute medications, and physical therapy. Discussed medication side effects, adverse reactions and drug interactions. Written educational materials and patient instructions outlining all of the above were given.  20, MD 11/16/21 2:40 PM

## 2021-11-17 ENCOUNTER — Ambulatory Visit (HOSPITAL_COMMUNITY): Payer: PRIVATE HEALTH INSURANCE

## 2021-11-17 ENCOUNTER — Encounter (HOSPITAL_COMMUNITY): Payer: Self-pay

## 2021-11-17 DIAGNOSIS — G4486 Cervicogenic headache: Secondary | ICD-10-CM

## 2021-11-17 DIAGNOSIS — M542 Cervicalgia: Secondary | ICD-10-CM

## 2021-11-17 NOTE — Therapy (Signed)
OUTPATIENT PHYSICAL THERAPY CERVICAL EVALUATION   Patient Name: Tony Stephens MRN: [id number] DOB:1985/10/27, 36 y.o., male Today's Date: 11/17/2021   PT End of Session - 11/17/21 1434     Visit Number 3    Number of Visits 6    Date for PT Re-Evaluation 11/30/21    Authorization Type [employer], 60 visit limit, no co-pay, no auth    Authorization - Visit Number 2    Authorization - Number of Visits 60    Progress Note Due on Visit 6    PT Start Time 1435    PT Stop Time 1515    PT Time Calculation (min) 40 min    Activity Tolerance Patient tolerated treatment well    Behavior During Therapy WFL for tasks assessed/performed             Past Medical History:  Diagnosis Date   GERD (gastroesophageal reflux disease)    Headache    Hypertension    Past Surgical History:  Procedure Laterality Date   arm surgery Left    TESTICLE TORSION REDUCTION     There are no problems to display for this patient.   PCP: [facility]  REFERRING PROVIDER: Ocie Doyne, MD  REFERRING DIAG: M54.2 (ICD-10-CM) - Cervicalgia   THERAPY DIAG:  Neck pain  Cervicogenic headache  ONSET DATE: Jan 2023  SUBJECTIVE:                                                                                                                                                                                                         SUBJECTIVE STATEMENT: Patient reports after last session no increases in pain or headache. Patient currently has slight headache. When doing HEP pt gets temporary relief lasting .   PERTINENT HISTORY:  Hx of low and mid back pain as well.  No surgical hx or injuries  PAIN:  Are you having pain? Yes: NPRS scale: 5/10 Pain location: neck, head, shoulders and sometimes chest Pain description: stiffness, headaches Aggravating factors: worse as the day progresses Relieving factors: ice, medication  PRECAUTIONS: None  WEIGHT BEARING RESTRICTIONS No  FALLS:  Has  patient fallen in last 6 months? No  OCCUPATION: Emergency planning/management officer  PLOF: Independent  PATIENT GOALS  have less pain and stiffness in neck  OBJECTIVE:   DIAGNOSTIC FINDINGS:  CLINICAL DATA:  Low back pain; unspecified back pain laterality, unspecified chronicity, unspecified whether sciatica present. Lower back pain for 1 month.   EXAM: LUMBAR SPINE - 2-3 VIEW   COMPARISON:  X-ray lumbar 10/01/2016.   FINDINGS: There  is no evidence of lumbar spine fracture. Alignment is normal. Mild degenerative disc disease is noted at L5-S1.   IMPRESSION: Mild degenerative disc disease is noted at L5-S1. No acute abnormality is seen. CLINICAL DATA:  Thoracic back pain for 1 month.   EXAM: THORACIC SPINE - 3 VIEWS   COMPARISON:  None.   FINDINGS: There is no evidence of thoracic spine fracture. Alignment is normal. No other significant bone abnormalities are identified.   IMPRESSION: Negative.  [facility]   NEUROIMAGING REPORT     STUDY DATE: 09/19/21 PATIENT NAME: Domnick Chervenak DOB: [birth year] MRN: [id number]   ORDERING CLINICIAN: Ocie Doyne, MD  CLINICAL HISTORY: 36 year old male with headaches.   EXAM: MR BRAIN W WO CONTRAST  TECHNIQUE: MRI of the brain with and without contrast was obtained utilizing multiplanar, multiecho pulse sequences. CONTRAST: 70ml multihance  COMPARISON: 07/28/21   IMAGING SITE: [facility]       FINDINGS:    No abnormal lesions are seen on diffusion-weighted views to suggest acute ischemia. The cortical sulci, fissures and cisterns are normal in size and appearance. Lateral, third and fourth ventricle are normal in size and appearance. No extra-axial fluid collections are seen. No evidence of mass effect or midline shift.  Few punctate foci of nonspecific T2 hyperintensities in the subcortical white matter.  No abnormal lesions are seen on post contrast views.     On sagittal views  the posterior fossa, pituitary gland and corpus callosum are unremarkable. No evidence of intracranial hemorrhage on SWI views. The orbits and their contents, paranasal sinuses and calvarium are unremarkable.  Intracranial flow voids are present.     IMPRESSION:    Unremarkable MRI brain (with and without). No acute findings.   PATIENT SURVEYS:  FOTO 58   COGNITION: Overall cognitive status: Within functional limits for tasks assessed   SENSATION: WFL  POSTURE:  Slight forward head and rounded shoulders  PALPATION: Mild cervical spine tenderness   CERVICAL ROM:   Active ROM A/PROM (deg) 11/09/2021  Flexion wfl  Extension Limited by 20%  Right lateral flexion Limited by 20%  Left lateral flexion Limited by 20%  Right rotation Limted by 20%   Left rotation Limited by 20%   (Blank rows = not tested)  UE MMT:  MMT Right 11/09/2021 Left 11/09/2021  Shoulder flexion 5 5  Shoulder extension    Shoulder abduction 5 5  Shoulder adduction    Shoulder extension    Shoulder internal rotation 5 5  Shoulder external rotation 4 4  Middle trapezius    Lower trapezius    Elbow flexion 5 5  Elbow extension 4+ 4+  Wrist flexion    Wrist extension    Wrist ulnar deviation    Wrist radial deviation    Wrist pronation    Wrist supination    Grip strength     (Blank rows = not tested)  CERVICAL SPECIAL TESTS:  Distraction test: Positive decreased pain with distraction    PATIENT SURVEYS:  FOTO 58  TODAY'S TREATMENT:   11/17/21 -Seated upper trap and leavtor stretches 15 sec hold x2 each  -Contract relax for lateral neck flexion and neck extension x5 reps each -STM occipital and upper trap region -Manual cervical distraction -Theraband resisted neck extension with chin tuck, and lateral resisted flexions -Tspine seated manipulation  11/13/21 Upper trap and levator seated stretch x3 each b/l 10 sec holds STM occipital/cervical and upper trap area x15 mins  Cervical  manual distraction x3 mins Self SNAGS for Extension and rotation x6 each Supine chin tuck with elevation x10 Quadruped chin tuck with neck flexion and extension x5 Quadruped scap plus 1 with neck extension with green tband x10    Physical therapy evaluation and HEP instruction, postural education Manual traction 5" hold x 10 in supine   PATIENT EDUCATION:  Education details: updated HEP Person educated: Patient Education method: Explanation, Demonstration, and Handouts Education comprehension: verbalized understanding, returned demonstration, and needs further education   HOME EXERCISE PROGRAM: Access Code: [email or web address] URL: [email or web address] Date: 11/09/2021 Prepared by: [facility] - Rehab  Exercises - Seated Cervical Retraction  - 1 x daily - 7 x weekly - 1 sets - 10 reps - Supine Chin Tuck  - 1 x daily - 7 x weekly - 1 sets - 10 reps - Supine Passive Cervical Retraction  - 1 x daily - 7 x weekly - 1 sets - 10 reps  Access Code: [email or web address] URL: [email or web address] Date: 11/13/2021 Prepared by: Aleatha Borer  Exercises - Seated Upper Trapezius Stretch  - 2 x daily - 7 x weekly - 1 sets - 10 reps - 10 hold - Seated Levator Scapulae Stretch  - 2 x daily - 7 x weekly - 1 sets - 10 reps - 10 hold - Quadruped Cervical Flexion and Extension  - 1 x daily - 7 x weekly - 3 sets - 10 reps - 2 hold -Quadruped scap plus 1 with resisted neck extensions 1 daily- 7x weekly - 3 sets- 10 reps - 2hold  ASSESSMENT:  CLINICAL IMPRESSION: Patient with no increase in headache with neck movements. Pt fatigues on neck extension with tuck on rep 8 against resistance, able to show improved technique with short rest. Successful tspine manipulation in seated, patient reports back feels good after. HEP updated. Continue with POC to work towards pain alleviation and decrease intensity and frequency of headaches.    OBJECTIVE IMPAIRMENTS decreased activity tolerance, decreased  endurance, decreased knowledge of condition, decreased mobility, decreased ROM, hypomobility, increased fascial restrictions, postural dysfunction, and pain.   ACTIVITY LIMITATIONS cleaning, community activity, driving, meal prep, occupation, laundry, medication management, yard work, and shopping.   PERSONAL FACTORS Profession are also affecting patient's functional outcome.    REHAB POTENTIAL: Good  CLINICAL DECISION MAKING: Stable/uncomplicated  EVALUATION COMPLEXITY: Low   GOALS: Goals reviewed with patient? No  SHORT TERM GOALS: Target date: 11/16/2021     patient will be independent with initial HEP Baseline:  Goal status: INITIAL  Pall TERM GOALS: Target date: 11/30/2021 Patient will be independent with advanced HEP and self management strategies to improve quality of life and functional outcomes.  Baseline:  Goal status: INITIAL  2.  Patient will be able to demonstrate pain free cervical motion in all tested motions/directions (flexion/extension/SB R/SB L/ ROT R/ ROT L)  Baseline: pain with all movements Goal status: INITIAL  3.  Patient will work all day without headache pain greater than 2/10 to improve work efficiency Baseline: 4/10 Goal status: INITIAL  4.   Patient will improve FOTO score to predicted value to demonstrate improved functional mobility.  Baseline: 58 Goal status: INITIAL  5.  Patient will report at least 75% improvement in overall symptoms and/or function to demonstrate improved functional mobility   Baseline:  Goal status: INITIAL   PLAN: PT FREQUENCY: 2x/week  PT DURATION: 3 weeks  PLANNED INTERVENTIONS: Therapeutic exercises, Therapeutic activity, Neuromuscular re-education, Balance training, Gait training, Patient/Family education, Joint manipulation, Joint mobilization, Stair  training, Vestibular training, Canalith repositioning, DME instructions, Aquatic Therapy, Dry Needling, Electrical stimulation, Spinal manipulation, Spinal  mobilization, Cryotherapy, Moist heat, Manual lymph drainage, scar mobilization, Splintting, Taping, Traction, Ultrasound, and Manual therapy  PLAN FOR NEXT SESSION: incorporate neck work into standing posture exercises. Based on pt response continue with distraction and NAGS.    2:35 PM, 11/17/21 Piper Albro PT, DPT

## 2021-11-20 ENCOUNTER — Ambulatory Visit (HOSPITAL_COMMUNITY): Payer: PRIVATE HEALTH INSURANCE | Admitting: Physical Therapy

## 2021-11-20 ENCOUNTER — Encounter (HOSPITAL_COMMUNITY): Payer: Self-pay | Admitting: Physical Therapy

## 2021-11-20 DIAGNOSIS — M542 Cervicalgia: Secondary | ICD-10-CM | POA: Diagnosis not present

## 2021-11-20 DIAGNOSIS — G4486 Cervicogenic headache: Secondary | ICD-10-CM

## 2021-11-20 NOTE — Therapy (Signed)
OUTPATIENT PHYSICAL THERAPY CERVICAL EVALUATION   Patient Name: Tony Stephens MRN: [id number] DOB:03/29/1986, 36 y.o., male Today's Date: 11/20/2021   PT End of Session - 11/20/21 1405     Visit Number 4    Number of Visits 6    Date for PT Re-Evaluation 11/30/21    Authorization Type [employer], 60 visit limit, no co-pay, no auth    Authorization - Visit Number 4    Authorization - Number of Visits 60    Progress Note Due on Visit 6    PT Start Time 1937    PT Stop Time 1448    PT Time Calculation (min) 43 min    Activity Tolerance Patient tolerated treatment well    Behavior During Therapy WFL for tasks assessed/performed             Past Medical History:  Diagnosis Date   GERD (gastroesophageal reflux disease)    Headache    Hypertension    Past Surgical History:  Procedure Laterality Date   arm surgery Left    TESTICLE TORSION REDUCTION     There are no problems to display for this patient.   PCP: [facility] PROVIDER: Genia Harold, MD  REFERRING DIAG: M54.2 (ICD-10-CM) - Cervicalgia   THERAPY DIAG:  Neck pain  Cervicogenic headache  ONSET DATE: Jan 2023  SUBJECTIVE:                                                                                                                                                                                                         SUBJECTIVE STATEMENT: States continued headaches. Starts in neck and turns to headache. Been doing exercises. Sometimes chin tucks help with headache sometimes not.   PERTINENT HISTORY:  Hx of low and mid back pain as well.  No surgical hx or injuries  PAIN:  Are you having pain? Yes: NPRS scale: 3/10 Pain location: neck, head, shoulders and sometimes chest Pain description: stiffness, headaches Aggravating factors: worse as the day progresses Relieving factors: ice, medication  PRECAUTIONS: None  WEIGHT BEARING RESTRICTIONS No  FALLS:  Has patient fallen in last 6  months? No  OCCUPATION: Engineer, structural  PLOF: Independent  PATIENT GOALS  have less pain and stiffness in neck  OBJECTIVE:   DIAGNOSTIC FINDINGS:  CLINICAL DATA:  Low back pain; unspecified back pain laterality, unspecified chronicity, unspecified whether sciatica present. Lower back pain for 1 month.   EXAM: LUMBAR SPINE - 2-3 VIEW   COMPARISON:  X-ray lumbar 10/01/2016.   FINDINGS: There is no evidence of  lumbar spine fracture. Alignment is normal. Mild degenerative disc disease is noted at L5-S1.   IMPRESSION: Mild degenerative disc disease is noted at L5-S1. No acute abnormality is seen. CLINICAL DATA:  Thoracic back pain for 1 month.   EXAM: THORACIC SPINE - 3 VIEWS   COMPARISON:  None.   FINDINGS: There is no evidence of thoracic spine fracture. Alignment is normal. No other significant bone abnormalities are identified.   IMPRESSION: Negative.  [facility]   NEUROIMAGING REPORT     STUDY DATE: 09/19/21 PATIENT NAME: Tony Stephens DOB: [birth year] MRN: [id number]   ORDERING CLINICIAN: Genia Harold, MD  CLINICAL HISTORY: 36 year old male with headaches.   EXAM: MR BRAIN W WO CONTRAST  TECHNIQUE: MRI of the brain with and without contrast was obtained utilizing multiplanar, multiecho pulse sequences. CONTRAST: 10ml multihance  COMPARISON: 07/28/21   IMAGING SITE: [facility]       FINDINGS:    No abnormal lesions are seen on diffusion-weighted views to suggest acute ischemia. The cortical sulci, fissures and cisterns are normal in size and appearance. Lateral, third and fourth ventricle are normal in size and appearance. No extra-axial fluid collections are seen. No evidence of mass effect or midline shift.  Few punctate foci of nonspecific T2 hyperintensities in the subcortical white matter.  No abnormal lesions are seen on post contrast views.     On sagittal views the posterior fossa,  pituitary gland and corpus callosum are unremarkable. No evidence of intracranial hemorrhage on SWI views. The orbits and their contents, paranasal sinuses and calvarium are unremarkable.  Intracranial flow voids are present.     IMPRESSION:    Unremarkable MRI brain (with and without). No acute findings.   PATIENT SURVEYS:  FOTO 58   COGNITION: Overall cognitive status: Within functional limits for tasks assessed   SENSATION: WFL  POSTURE:  Slight forward head and rounded shoulders  PALPATION: Mild cervical spine tenderness   CERVICAL ROM:   Active ROM A/PROM (deg) 11/09/2021  Flexion wfl  Extension Limited by 20%  Right lateral flexion Limited by 20%  Left lateral flexion Limited by 20%  Right rotation Limted by 20%   Left rotation Limited by 20%   (Blank rows = not tested)  UE MMT:  MMT Right 11/09/2021 Left 11/09/2021  Shoulder flexion 5 5  Shoulder extension    Shoulder abduction 5 5  Shoulder adduction    Shoulder extension    Shoulder internal rotation 5 5  Shoulder external rotation 4 4  Middle trapezius    Lower trapezius    Elbow flexion 5 5  Elbow extension 4+ 4+  Wrist flexion    Wrist extension    Wrist ulnar deviation    Wrist radial deviation    Wrist pronation    Wrist supination    Grip strength     (Blank rows = not tested)  CERVICAL SPECIAL TESTS:  Distraction test: Positive decreased pain with distraction    PATIENT SURVEYS:  FOTO 58  TODAY'S TREATMENT:  11/20/21 Chin tuck x 20 Cervical retraction - extension 2x 10  Cervical retraction - extension - rotation 2x 10  Cervical retraction isometric at wall 2x 10 5 second holds Horizontal abduction blue band 2x 10 with cervical retraction Scap retraction with bilateral GH ER blue band 2x 10 with cervical retraction  TTP bilateral suboccipitals with symptom reproduction    11/17/21 -Seated upper trap and leavtor stretches 15 sec hold x2 each  -  Contract relax for lateral  neck flexion and neck extension x5 reps each -STM occipital and upper trap region -Manual cervical distraction -Theraband resisted neck extension with chin tuck, and lateral resisted flexions -Tspine seated manipulation  11/13/21 Upper trap and levator seated stretch x3 each b/l 10 sec holds STM occipital/cervical and upper trap area x15 mins Cervical manual distraction x3 mins Self SNAGS for Extension and rotation x6 each Supine chin tuck with elevation x10 Quadruped chin tuck with neck flexion and extension x5 Quadruped scap plus 1 with neck extension with green tband x10    Physical therapy evaluation and HEP instruction, postural education Manual traction 5" hold x 10 in supine   PATIENT EDUCATION:  Education details: updated HEP Person educated: Patient Education method: Explanation, Demonstration, and Handouts Education comprehension: verbalized understanding, returned demonstration, and needs further education   HOME EXERCISE PROGRAM: Access Code: [email or web address] URL: [email or web address] Date: 11/09/2021 Prepared by: [facility] - Rehab  Exercises - Seated Cervical Retraction  - 1 x daily - 7 x weekly - 1 sets - 10 reps - Supine Chin Tuck  - 1 x daily - 7 x weekly - 1 sets - 10 reps - Supine Passive Cervical Retraction  - 1 x daily - 7 x weekly - 1 sets - 10 reps  Access Code: [email or web address] URL: [email or web address] Date: 11/13/2021 Prepared by: Leota Jacobsen  Exercises - Seated Upper Trapezius Stretch  - 2 x daily - 7 x weekly - 1 sets - 10 reps - 10 hold - Seated Levator Scapulae Stretch  - 2 x daily - 7 x weekly - 1 sets - 10 reps - 10 hold - Quadruped Cervical Flexion and Extension  - 1 x daily - 7 x weekly - 3 sets - 10 reps - 2 hold -Quadruped scap plus 1 with resisted neck extensions 1 daily- 7x weekly - 3 sets- 10 reps - 2hold 11/20/21 - Seated Cervical Retraction and Extension  - 1 x daily - 7 x weekly - 2 sets - 10  reps  ASSESSMENT:  CLINICAL IMPRESSION: Patient with slight headache symptoms at beginning of session. Began session with cervical retraction and trailed ret- ext, ret - ext- rotation in seated with minimal to no change in symptoms. Cueing for slight cervical retraction with all postural strengthening exercises. Symptom reproduction with palpation of suboccipitals. Patient educated and shown how to perform self STM with ball. Patient will continue to benefit from physical therapy in order to improve function and reduce impairment.    OBJECTIVE IMPAIRMENTS decreased activity tolerance, decreased endurance, decreased knowledge of condition, decreased mobility, decreased ROM, hypomobility, increased fascial restrictions, postural dysfunction, and pain.   ACTIVITY LIMITATIONS cleaning, community activity, driving, meal prep, occupation, laundry, medication management, yard work, and shopping.   PERSONAL FACTORS Profession are also affecting patient's functional outcome.    REHAB POTENTIAL: Good  CLINICAL DECISION MAKING: Stable/uncomplicated  EVALUATION COMPLEXITY: Low   GOALS: Goals reviewed with patient? No  SHORT TERM GOALS: Target date: 11/16/2021     patient will be independent with initial HEP Baseline:  Goal status: INITIAL  [place] TERM GOALS: Target date: 11/30/2021 Patient will be independent with advanced HEP and self management strategies to improve quality of life and functional outcomes.  Baseline:  Goal status: INITIAL  2.  Patient will be able to demonstrate pain free cervical motion in all tested motions/directions (flexion/extension/SB R/SB L/ ROT R/ ROT L)  Baseline: pain with all movements Goal status: INITIAL  3.  Patient will work all day  without headache pain greater than 2/10 to improve work efficiency Baseline: 4/10 Goal status: INITIAL  4.   Patient will improve FOTO score to predicted value to demonstrate improved functional mobility.  Baseline:  58 Goal status: INITIAL  5.  Patient will report at least 75% improvement in overall symptoms and/or function to demonstrate improved functional mobility   Baseline:  Goal status: INITIAL   PLAN: PT FREQUENCY: 2x/week  PT DURATION: 3 weeks  PLANNED INTERVENTIONS: Therapeutic exercises, Therapeutic activity, Neuromuscular re-education, Balance training, Gait training, Patient/Family education, Joint manipulation, Joint mobilization, Stair training, Vestibular training, Canalith repositioning, DME instructions, Aquatic Therapy, Dry Needling, Electrical stimulation, Spinal manipulation, Spinal mobilization, Cryotherapy, Moist heat, Manual lymph drainage, scar mobilization, Splintting, Taping, Traction, Ultrasound, and Manual therapy  PLAN FOR NEXT SESSION: incorporate neck work into standing posture exercises. Based on pt response continue with distraction and NAGS.   2:53 PM, 11/20/21 Mearl Latin PT, DPT Physical Therapist at [facility]

## 2021-11-23 ENCOUNTER — Ambulatory Visit (HOSPITAL_COMMUNITY): Payer: PRIVATE HEALTH INSURANCE

## 2021-11-23 ENCOUNTER — Encounter (HOSPITAL_COMMUNITY): Payer: Self-pay

## 2021-11-23 DIAGNOSIS — M542 Cervicalgia: Secondary | ICD-10-CM | POA: Diagnosis not present

## 2021-11-23 DIAGNOSIS — G4486 Cervicogenic headache: Secondary | ICD-10-CM

## 2021-11-23 NOTE — Therapy (Signed)
OUTPATIENT PHYSICAL THERAPY CERVICAL EVALUATION   Patient Name: Tony Stephens MRN: [id number] DOB:[birth year], 36 y.o., male Today's Date: 11/23/2021   PT End of Session - 11/23/21 1509     Visit Number 5    Number of Visits 6    Date for PT Re-Evaluation 11/30/21    Authorization Type [employer], 60 visit limit, no co-pay, no auth    Authorization - Visit Number 5    Authorization - Number of Visits 60    Progress Note Due on Visit 6    PT Start Time 1435    PT Stop Time 1515    PT Time Calculation (min) 40 min    Activity Tolerance Patient tolerated treatment well    Behavior During Therapy WFL for tasks assessed/performed              Past Medical History:  Diagnosis Date   GERD (gastroesophageal reflux disease)    Headache    Hypertension    Past Surgical History:  Procedure Laterality Date   arm surgery Left    TESTICLE TORSION REDUCTION     There are no problems to display for this patient.   PCP: [facility]  REFERRING PROVIDER: Ocie Doyne, MD  REFERRING DIAG: M54.2 (ICD-10-CM) - Cervicalgia   THERAPY DIAG:  Neck pain  Cervicogenic headache  ONSET DATE: Jan 2023  SUBJECTIVE:                                                                                                                                                                                                         SUBJECTIVE STATEMENT: States that he is still having a headache but it is reduced since last session. Pt reports that he has been keeping up with HEP.   PERTINENT HISTORY:  Hx of low and mid back pain as well.  No surgical hx or injuries  PAIN:  Are you having pain? Yes: NPRS scale: 3/10 Pain location: neck, head, shoulders and sometimes chest Pain description: stiffness, headaches Aggravating factors: worse as the day progresses Relieving factors: ice, medication  PRECAUTIONS: None  WEIGHT BEARING RESTRICTIONS No  FALLS:  Has patient fallen in last 6 months?  No  OCCUPATION: Emergency planning/management officer  PLOF: Independent  PATIENT GOALS  have less pain and stiffness in neck  OBJECTIVE:   DIAGNOSTIC FINDINGS:  CLINICAL DATA:  Low back pain; unspecified back pain laterality, unspecified chronicity, unspecified whether sciatica present. Lower back pain for 1 month.   EXAM: LUMBAR SPINE - 2-3 VIEW   COMPARISON:  X-ray lumbar 10/01/2016.   FINDINGS:  There is no evidence of lumbar spine fracture. Alignment is normal. Mild degenerative disc disease is noted at L5-S1.   IMPRESSION: Mild degenerative disc disease is noted at L5-S1. No acute abnormality is seen. CLINICAL DATA:  Thoracic back pain for 1 month.   EXAM: THORACIC SPINE - 3 VIEWS   COMPARISON:  None.   FINDINGS: There is no evidence of thoracic spine fracture. Alignment is normal. No other significant bone abnormalities are identified.   IMPRESSION: Negative.  [facility]   NEUROIMAGING REPORT     STUDY DATE: 09/19/21 PATIENT NAME: Tony Stephens DOB: [birth year] MRN: [id number]   ORDERING CLINICIAN: Ocie Doynehima, Jennifer, MD  CLINICAL HISTORY: 36 year old male with headaches.   EXAM: MR BRAIN W WO CONTRAST  TECHNIQUE: MRI of the brain with and without contrast was obtained utilizing multiplanar, multiecho pulse sequences. CONTRAST: 14ml multihance  COMPARISON: 07/28/21   IMAGING SITE: [facility]        FINDINGS:    No abnormal lesions are seen on diffusion-weighted views to suggest acute ischemia. The cortical sulci, fissures and cisterns are normal in size and appearance. Lateral, third and fourth ventricle are normal in size and appearance. No extra-axial fluid collections are seen. No evidence of mass effect or midline shift.  Few punctate foci of nonspecific T2 hyperintensities in the subcortical white matter.  No abnormal lesions are seen on post contrast views.     On sagittal views the posterior fossa, pituitary  gland and corpus callosum are unremarkable. No evidence of intracranial hemorrhage on SWI views. The orbits and their contents, paranasal sinuses and calvarium are unremarkable.  Intracranial flow voids are present.     IMPRESSION:    Unremarkable MRI brain (with and without). No acute findings.   PATIENT SURVEYS:  FOTO 58   COGNITION: Overall cognitive status: Within functional limits for tasks assessed   SENSATION: WFL  POSTURE:  Slight forward head and rounded shoulders  PALPATION: Mild cervical spine tenderness   CERVICAL ROM:   Active ROM A/PROM (deg) 11/09/2021  Flexion wfl  Extension Limited by 20%  Right lateral flexion Limited by 20%  Left lateral flexion Limited by 20%  Right rotation Limted by 20%   Left rotation Limited by 20%   (Blank rows = not tested)  UE MMT:  MMT Right 11/09/2021 Left 11/09/2021  Shoulder flexion 5 5  Shoulder extension    Shoulder abduction 5 5  Shoulder adduction    Shoulder extension    Shoulder internal rotation 5 5  Shoulder external rotation 4 4  Middle trapezius    Lower trapezius    Elbow flexion 5 5  Elbow extension 4+ 4+  Wrist flexion    Wrist extension    Wrist ulnar deviation    Wrist radial deviation    Wrist pronation    Wrist supination    Grip strength     (Blank rows = not tested)  CERVICAL SPECIAL TESTS:  Distraction test: Positive decreased pain with distraction    PATIENT SURVEYS:  FOTO 58  TODAY'S TREATMENT:  11/23/21 Interlocking fingers posterior to neck, incline with distraction as per Verline LemaMulligan concept20 sec hold x4 Fist traction for flexion as per Verline LemaMulligan concept 15 sec x3 NAGS for upper cervical spine as per Verline LemaMulligan concept Chin tuck with rotation x10 b/l  Chin tuck with rotation and manual overpressure x5 b/l  Chin tuck with rotation in supine with tennis balls posterior x10 b/l  Chin tuck with neck  flexion iso hold x10 seconds x5 reps  DN cervical region with mini  fasciculations in areas where DN performed (performed by Yvonna Alanis PT)      11/20/21 Chin tuck x 20 Cervical retraction - extension 2x 10  Cervical retraction - extension - rotation 2x 10  Cervical retraction isometric at wall 2x 10 5 second holds Horizontal abduction blue band 2x 10 with cervical retraction Scap retraction with bilateral GH ER blue band 2x 10 with cervical retraction  TTP bilateral suboccipitals with symptom reproduction    11/17/21 -Seated upper trap and leavtor stretches 15 sec hold x2 each  -Contract relax for lateral neck flexion and neck extension x5 reps each -STM occipital and upper trap region -Manual cervical distraction -Theraband resisted neck extension with chin tuck, and lateral resisted flexions -Tspine seated manipulation  11/13/21 Upper trap and levator seated stretch x3 each b/l 10 sec holds STM occipital/cervical and upper trap area x15 mins Cervical manual distraction x3 mins Self SNAGS for Extension and rotation x6 each Supine chin tuck with elevation x10 Quadruped chin tuck with neck flexion and extension x5 Quadruped scap plus 1 with neck extension with green tband x10    Physical therapy evaluation and HEP instruction, postural education Manual traction 5" hold x 10 in supine   PATIENT EDUCATION:  Education details: updated HEP Person educated: Patient Education method: Explanation, Demonstration, and Handouts Education comprehension: verbalized understanding, returned demonstration, and needs further education   HOME EXERCISE PROGRAM: Access Code: [email or web address] URL: [email or web address] Date: 11/09/2021 Prepared by: [facility] - Rehab  Exercises - Seated Cervical Retraction  - 1 x daily - 7 x weekly - 1 sets - 10 reps - Supine Chin Tuck  - 1 x daily - 7 x weekly - 1 sets - 10 reps - Supine Passive Cervical Retraction  - 1 x daily - 7 x weekly - 1 sets - 10 reps  Access Code: [email or web address] URL:  [email or web address] Date: 11/13/2021 Prepared by: Aleatha Borer  Exercises - Seated Upper Trapezius Stretch  - 2 x daily - 7 x weekly - 1 sets - 10 reps - 10 hold - Seated Levator Scapulae Stretch  - 2 x daily - 7 x weekly - 1 sets - 10 reps - 10 hold - Quadruped Cervical Flexion and Extension  - 1 x daily - 7 x weekly - 3 sets - 10 reps - 2 hold -Quadruped scap plus 1 with resisted neck extensions 1 daily- 7x weekly - 3 sets- 10 reps - 2hold 11/20/21 - Seated Cervical Retraction and Extension  - 1 x daily - 7 x weekly - 2 sets - 10 reps  ASSESSMENT:  CLINICAL IMPRESSION: Patient with slight headache symptoms at beginning of session but reports that headaches are better than they were. Palpation to suboccipitals are no symptom provoking as they were at last session. Pt tolerates all exercises without reports of increased headache. Added in NAGS and self NAGS to today's treatment.Patient will continue to benefit from physical therapy in order to improve function and reduce impairment.    OBJECTIVE IMPAIRMENTS decreased activity tolerance, decreased endurance, decreased knowledge of condition, decreased mobility, decreased ROM, hypomobility, increased fascial restrictions, postural dysfunction, and pain.   ACTIVITY LIMITATIONS cleaning, community activity, driving, meal prep, occupation, laundry, medication management, yard work, and shopping.   PERSONAL FACTORS Profession are also affecting patient's functional outcome.    REHAB POTENTIAL: Good  CLINICAL DECISION MAKING: Stable/uncomplicated  EVALUATION COMPLEXITY: Low   GOALS: Goals reviewed with patient? No  SHORT TERM GOALS: Target date: 11/16/2021     patient will be independent with initial HEP Baseline:  Goal status: INITIAL  [place] TERM GOALS: Target date: 11/30/2021 Patient will be independent with advanced HEP and self management strategies to improve quality of life and functional outcomes.  Baseline:   Goal status: INITIAL  2.  Patient will be able to demonstrate pain free cervical motion in all tested motions/directions (flexion/extension/SB R/SB L/ ROT R/ ROT L)  Baseline: pain with all movements Goal status: INITIAL  3.  Patient will work all day without headache pain greater than 2/10 to improve work efficiency Baseline: 4/10 Goal status: INITIAL  4.   Patient will improve FOTO score to predicted value to demonstrate improved functional mobility.  Baseline: 58 Goal status: INITIAL  5.  Patient will report at least 75% improvement in overall symptoms and/or function to demonstrate improved functional mobility   Baseline:  Goal status: INITIAL   PLAN: PT FREQUENCY: 2x/week  PT DURATION: 3 weeks  PLANNED INTERVENTIONS: Therapeutic exercises, Therapeutic activity, Neuromuscular re-education, Balance training, Gait training, Patient/Family education, Joint manipulation, Joint mobilization, Stair training, Vestibular training, Canalith repositioning, DME instructions, Aquatic Therapy, Dry Needling, Electrical stimulation, Spinal manipulation, Spinal mobilization, Cryotherapy, Moist heat, Manual lymph drainage, scar mobilization, Splintting, Taping, Traction, Ultrasound, and Manual therapy  PLAN FOR NEXT SESSION: continue to incorporate cervical retraction with postural exercises and neck movements. Based on pt response continue with distraction and NAGS.   4:20 PM, 11/23/21 Shanda Cadotte PT, DPT

## 2021-11-28 ENCOUNTER — Telehealth (HOSPITAL_COMMUNITY): Payer: Self-pay

## 2021-11-28 ENCOUNTER — Ambulatory Visit (HOSPITAL_COMMUNITY): Payer: PRIVATE HEALTH INSURANCE | Attending: Psychiatry

## 2021-11-28 NOTE — Telephone Encounter (Signed)
Left voice mail about missed appointment, No show policy, and upcoming visit.

## 2021-11-30 ENCOUNTER — Encounter (HOSPITAL_COMMUNITY): Payer: Self-pay

## 2021-11-30 ENCOUNTER — Ambulatory Visit (HOSPITAL_COMMUNITY): Payer: PRIVATE HEALTH INSURANCE | Attending: Psychiatry

## 2021-11-30 DIAGNOSIS — M542 Cervicalgia: Secondary | ICD-10-CM | POA: Insufficient documentation

## 2021-11-30 DIAGNOSIS — G4486 Cervicogenic headache: Secondary | ICD-10-CM | POA: Insufficient documentation

## 2021-11-30 NOTE — Therapy (Signed)
OUTPATIENT PHYSICAL THERAPY CERVICAL EVALUATION   Patient Name: Tony Stephens MRN: [id number] DOB:Oct 06, 1985, 36 y.o., male Today's Date: 11/30/2021   PT End of Session - 11/30/21 0807     Visit Number 6    Number of Visits 12    Date for PT Re-Evaluation 12/24/21    Authorization Type [employer], 60 visit limit, no co-pay, no auth    Authorization - Visit Number 6    Authorization - Number of Visits 60    Progress Note Due on Visit 12    PT Start Time 0815    PT Stop Time 0855    PT Time Calculation (min) 40 min    Activity Tolerance Patient tolerated treatment well    Behavior During Therapy WFL for tasks assessed/performed              Past Medical History:  Diagnosis Date   GERD (gastroesophageal reflux disease)    Headache    Hypertension    Past Surgical History:  Procedure Laterality Date   arm surgery Left    TESTICLE TORSION REDUCTION     There are no problems to display for this patient.   PCP: [place] PROVIDER: Genia Harold, MD  REFERRING DIAG: M54.2 (ICD-10-CM) - Cervicalgia   THERAPY DIAG:  Neck pain  Cervicogenic headache  ONSET DATE: Jan 2023  SUBJECTIVE:                                                                                                                                                                                                         SUBJECTIVE STATEMENT: Patient states that he was doing ok with less to no headaches but has a slight headache today. Patient feels that physical therapy has been helping him.   PERTINENT HISTORY:  Hx of low and mid back pain as well.  No surgical hx or injuries  PAIN:  Are you having pain? Yes: NPRS scale: 2/10 Pain location: Average headache 3/10 pain. neck, head, shoulders and sometimes chest Pain description: stiffness, headaches Aggravating factors: worse as the day progresses Relieving factors: ice, medication  PRECAUTIONS: None  WEIGHT BEARING RESTRICTIONS  No  FALLS:  Has patient fallen in last 6 months? No  OCCUPATION: Engineer, structural  PLOF: Independent  PATIENT GOALS  have less pain and stiffness in neck  OBJECTIVE:   DIAGNOSTIC FINDINGS:  CLINICAL DATA:  Low back pain; unspecified back pain laterality, unspecified chronicity, unspecified whether sciatica present. Lower back pain for 1 month.   EXAM: LUMBAR SPINE - 2-3 VIEW   COMPARISON:  X-ray lumbar 10/01/2016.   FINDINGS: There is no evidence of lumbar spine fracture. Alignment is normal. Mild degenerative disc disease is noted at L5-S1.   IMPRESSION: Mild degenerative disc disease is noted at L5-S1. No acute abnormality is seen. CLINICAL DATA:  Thoracic back pain for 1 month.   EXAM: THORACIC SPINE - 3 VIEWS   COMPARISON:  None.   FINDINGS: There is no evidence of thoracic spine fracture. Alignment is normal. No other significant bone abnormalities are identified.   IMPRESSION: Negative.  [facility]   NEUROIMAGING REPORT     STUDY DATE: 09/19/21 PATIENT NAME: Tony Stephens DOB: Aug 04, 1985 MRN: [id number]   ORDERING CLINICIAN: Genia Harold, MD  CLINICAL HISTORY: 36 year old male with headaches.   EXAM: MR BRAIN W WO CONTRAST  TECHNIQUE: MRI of the brain with and without contrast was obtained utilizing multiplanar, multiecho pulse sequences. CONTRAST: 22m multihance  COMPARISON: 07/28/21   IMAGING SITE:  [facility] [place]       FINDINGS:    No abnormal lesions are seen on diffusion-weighted views to suggest acute ischemia. The cortical sulci, fissures and cisterns are normal in size and appearance. Lateral, third and fourth ventricle are normal in size and appearance. No extra-axial fluid collections are seen. No evidence of mass effect or midline shift.  Few punctate foci of nonspecific T2 hyperintensities in the subcortical white matter.  No abnormal lesions are seen on post contrast views.      On sagittal views the posterior fossa, pituitary gland and corpus callosum are unremarkable. No evidence of intracranial hemorrhage on SWI views. The orbits and their contents, paranasal sinuses and calvarium are unremarkable.  Intracranial flow voids are present.     IMPRESSION:    Unremarkable MRI brain (with and without). No acute findings.   PATIENT SURVEYS:  FOTO Initial 58, Current 671  COGNITION: Overall cognitive status: Within functional limits for tasks assessed   SENSATION: WFL  POSTURE:  Slight forward head and rounded shoulders  PALPATION: Mild cervical spine tenderness   CERVICAL ROM:   Active ROM A/PROM (deg) 11/09/2021 AROM 11/30/21  Flexion wfl 50 degrees  Extension Limited by 20% 50 degrees  Right lateral flexion Limited by 20% 34 degrees  Left lateral flexion Limited by 20% 42 degrees  Right rotation Limted by 20%  52 degrees  Left rotation Limited by 20% 60 degrees   (Blank rows = not tested)  Patient reports twinge with right and left rotation with extensions as well as pure neck extension  UE MMT:  MMT Right 11/09/2021 Left 11/09/2021  Shoulder flexion 5 5  Shoulder extension    Shoulder abduction 5 5  Shoulder adduction    Shoulder extension    Shoulder internal rotation 5 5  Shoulder external rotation 4 4  Middle trapezius    Lower trapezius    Elbow flexion 5 5  Elbow extension 4+ 4+  Wrist flexion    Wrist extension    Wrist ulnar deviation    Wrist radial deviation    Wrist pronation    Wrist supination    Grip strength     (Blank rows = not tested)  CERVICAL SPECIAL TESTS:  Distraction test: Positive decreased pain with distraction    PATIENT SURVEYS:  FOTO 58 FOTO Initial 58, Current 66  TODAY'S TREATMENT:  11/30/21 Reassessment STM at sub occipitals and upper trap NAGS for upper cervical spine as per MJanyce Llanosconcept Belt distraction hang with 1# weight for  headaches as per Janyce Llanos concept Fist traction for  flexion as per Janyce Llanos concept 15 sec x3   11/23/21 Interlocking fingers posterior to neck, incline with distraction as per Janyce Llanos concept20 sec hold x4 Fist traction for flexion as per Janyce Llanos concept 15 sec x3 NAGS for upper cervical spine as per Janyce Llanos concept Chin tuck with rotation x10 b/l  Chin tuck with rotation and manual overpressure x5 b/l  Chin tuck with rotation in supine with tennis balls posterior x10 b/l  Chin tuck with neck flexion iso hold x10 seconds x5 reps  DN cervical region with mini fasciculations in areas where DN performed (performed by Marina Gravel PT)      11/20/21 Chin tuck x 20 Cervical retraction - extension 2x 10  Cervical retraction - extension - rotation 2x 10  Cervical retraction isometric at wall 2x 10 5 second holds Horizontal abduction blue band 2x 10 with cervical retraction Scap retraction with bilateral GH ER blue band 2x 10 with cervical retraction  TTP bilateral suboccipitals with symptom reproduction    11/17/21 -Seated upper trap and leavtor stretches 15 sec hold x2 each  -Contract relax for lateral neck flexion and neck extension x5 reps each -STM occipital and upper trap region -Manual cervical distraction -Theraband resisted neck extension with chin tuck, and lateral resisted flexions -Tspine seated manipulation  11/13/21 Upper trap and levator seated stretch x3 each b/l 10 sec holds STM occipital/cervical and upper trap area x15 mins Cervical manual distraction x3 mins Self SNAGS for Extension and rotation x6 each Supine chin tuck with elevation x10 Quadruped chin tuck with neck flexion and extension x5 Quadruped scap plus 1 with neck extension with green tband x10    Physical therapy evaluation and HEP instruction, postural education Manual traction 5" hold x 10 in supine   PATIENT EDUCATION:  Education details: updated HEP Person educated: Patient Education method: Explanation, Demonstration, and  Handouts Education comprehension: verbalized understanding, returned demonstration, and needs further education   HOME EXERCISE PROGRAM: Access Code: [email or web address] URL: [email or web address] Date: 11/09/2021 Prepared by: [facility] - Rehab  Exercises - Seated Cervical Retraction  - 1 x daily - 7 x weekly - 1 sets - 10 reps - Supine Chin Tuck  - 1 x daily - 7 x weekly - 1 sets - 10 reps - Supine Passive Cervical Retraction  - 1 x daily - 7 x weekly - 1 sets - 10 reps  Access Code: [email or web address] URL: [email or web address] Date: 11/13/2021 Prepared by: Leota Jacobsen  Exercises - Seated Upper Trapezius Stretch  - 2 x daily - 7 x weekly - 1 sets - 10 reps - 10 hold - Seated Levator Scapulae Stretch  - 2 x daily - 7 x weekly - 1 sets - 10 reps - 10 hold - Quadruped Cervical Flexion and Extension  - 1 x daily - 7 x weekly - 3 sets - 10 reps - 2 hold -Quadruped scap plus 1 with resisted neck extensions 1 daily- 7x weekly - 3 sets- 10 reps - 2hold 11/20/21 - Seated Cervical Retraction and Extension  - 1 x daily - 7 x weekly - 2 sets - 10 reps  ASSESSMENT:  CLINICAL IMPRESSION: Patient reports 40% improvement since starting therapy. Patient reports his headaches have been best in past week then they have been in a [place] time. Pt reports he is currently at a 2/10 and an average of 3/10 headache pain. Pts cervical ROM is improved with slightly more restriction on right side as compared to  left side. Patient with more limitation in right side bend is most restricted motion. Pt reports he has a good understanding of his initial HEP exercises. Patient reports twinge with right and left rotation with extensions as well as pure neck extension. Patient with 8 point improvement on FOTO at this time. Patient will continue to benefit from skilled PT services to work towards continued decrease in headache and improvement in neck rom.     OBJECTIVE IMPAIRMENTS decreased activity tolerance,  decreased endurance, decreased knowledge of condition, decreased mobility, decreased ROM, hypomobility, increased fascial restrictions, postural dysfunction, and pain.   ACTIVITY LIMITATIONS cleaning, community activity, driving, meal prep, occupation, laundry, medication management, yard work, and shopping.   PERSONAL FACTORS Profession are also affecting patient's functional outcome.    REHAB POTENTIAL: Good  CLINICAL DECISION MAKING: Stable/uncomplicated  EVALUATION COMPLEXITY: Low   GOALS: Goals reviewed with patient? No  SHORT TERM GOALS: Target date: 11/16/2021     patient will be independent with initial HEP Baseline:  Goal status: MET  [place] TERM GOALS: Target date: 12/24/2021 Patient will be independent with advanced HEP and self management strategies to improve quality of life and functional outcomes.  Baseline:  Goal status: IN PROGRESS  2.  Patient will be able to demonstrate pain free cervical motion in all tested motions/directions (flexion/extension/SB R/SB L/ ROT R/ ROT L)  Baseline: Patient reports twinge with right and left rotation with extensions as well as pure neck extension Goal status: IN PROGRESS  3.  Patient will work all day without headache pain greater than 2/10 to improve work efficiency Baseline: 3/10 Goal status: IN PROGRESS  4.   Patient will improve FOTO score to predicted value to demonstrate improved functional mobility.  Baseline: 58 Current: 66 Goal status: INITIAL  5.  Patient will report at least 75% improvement in overall symptoms and/or function to demonstrate improved functional mobility   Baseline: Current: 40 % improvement  Goal status: IN PROGRESS   PLAN: PT FREQUENCY: 2x/week  PT DURATION: 3 weeks  PLANNED INTERVENTIONS: Therapeutic exercises, Therapeutic activity, Neuromuscular re-education, Balance training, Gait training, Patient/Family education, Joint manipulation, Joint mobilization, Stair training, Vestibular  training, Canalith repositioning, DME instructions, Aquatic Therapy, Dry Needling, Electrical stimulation, Spinal manipulation, Spinal mobilization, Cryotherapy, Moist heat, Manual lymph drainage, scar mobilization, Splintting, Taping, Traction, Ultrasound, and Manual therapy  PLAN FOR NEXT SESSION: continue to incorporate cervical retraction with postural exercises and neck movements. Based on pt response continue with distraction and NAGS.   12:21 PM, 11/30/21 Naitik Hermann PT, DPT

## 2021-12-05 ENCOUNTER — Ambulatory Visit (HOSPITAL_COMMUNITY): Payer: PRIVATE HEALTH INSURANCE | Attending: Physician Assistant

## 2021-12-05 DIAGNOSIS — M542 Cervicalgia: Secondary | ICD-10-CM | POA: Insufficient documentation

## 2021-12-05 DIAGNOSIS — G4486 Cervicogenic headache: Secondary | ICD-10-CM

## 2021-12-05 NOTE — Therapy (Signed)
OUTPATIENT PHYSICAL THERAPY CERVICAL TREATMENT   Patient Name: Tony Stephens MRN: [id number] DOB:[birth year], 36 y.o., male Today's Date: 12/05/2021   PT End of Session - 12/05/21 0953     Visit Number 7    Number of Visits 12    Date for PT Re-Evaluation 12/24/21    Authorization Type [employer], 60 visit limit, no co-pay, no auth    Authorization - Visit Number 6    Authorization - Number of Visits 60    Progress Note Due on Visit 12    PT Start Time [phone]    PT Stop Time 1030    PT Time Calculation (min) 43 min    Activity Tolerance Patient tolerated treatment well    Behavior During Therapy WFL for tasks assessed/performed               Past Medical History:  Diagnosis Date   GERD (gastroesophageal reflux disease)    Headache    Hypertension    Past Surgical History:  Procedure Laterality Date   arm surgery Left    TESTICLE TORSION REDUCTION     There are no problems to display for this patient.   PCP: [place] PROVIDER: Genia Harold, MD  REFERRING DIAG: M54.2 (ICD-10-CM) - Cervicalgia   THERAPY DIAG:  Neck pain  Cervicogenic headache  ONSET DATE: Jan 2023  SUBJECTIVE:                                                                                                                                                                                                         SUBJECTIVE STATEMENT: Patient reports he is having some days without headache and when he does get one it's pretty mild. Thinks combination of therapy, DN at chiropractor and medications are all helping.    PERTINENT HISTORY:  Hx of low and mid back pain as well.  No surgical hx or injuries  PAIN:  Are you having pain? Yes: NPRS scale: 2/10 Pain location: Average headache 3/10 pain. neck, head, shoulders and sometimes chest Pain description: stiffness, headaches Aggravating factors: worse as the day progresses Relieving factors: ice, medication  PRECAUTIONS:  None  WEIGHT BEARING RESTRICTIONS No  FALLS:  Has patient fallen in last 6 months? No  OCCUPATION: Engineer, structural  PLOF: Independent  PATIENT GOALS  have less pain and stiffness in neck  OBJECTIVE:   DIAGNOSTIC FINDINGS:  CLINICAL DATA:  Low back pain; unspecified back pain laterality, unspecified chronicity, unspecified whether sciatica present. Lower back pain for 1 month.   EXAM: LUMBAR SPINE - 2-3  VIEW   COMPARISON:  X-ray lumbar 10/01/2016.   FINDINGS: There is no evidence of lumbar spine fracture. Alignment is normal. Mild degenerative disc disease is noted at L5-S1.   IMPRESSION: Mild degenerative disc disease is noted at L5-S1. No acute abnormality is seen. CLINICAL DATA:  Thoracic back pain for 1 month.   EXAM: THORACIC SPINE - 3 VIEWS   COMPARISON:  None.   FINDINGS: There is no evidence of thoracic spine fracture. Alignment is normal. No other significant bone abnormalities are identified.   IMPRESSION: Negative.  [facility]   NEUROIMAGING REPORT     STUDY DATE: 09/19/21 PATIENT NAME: Tony Stephens DOB: 1986-06-30 MRN: [id number]   ORDERING CLINICIAN: Genia Harold, MD  CLINICAL HISTORY: 36 year old male with headaches.   EXAM: MR BRAIN W WO CONTRAST  TECHNIQUE: MRI of the brain with and without contrast was obtained utilizing multiplanar, multiecho pulse sequences. CONTRAST: 75ml multihance  COMPARISON: 07/28/21   IMAGING SITE: [facility]       FINDINGS:    No abnormal lesions are seen on diffusion-weighted views to suggest acute ischemia. The cortical sulci, fissures and cisterns are normal in size and appearance. Lateral, third and fourth ventricle are normal in size and appearance. No extra-axial fluid collections are seen. No evidence of mass effect or midline shift.  Few punctate foci of nonspecific T2 hyperintensities in the subcortical white matter.  No abnormal lesions are  seen on post contrast views.     On sagittal views the posterior fossa, pituitary gland and corpus callosum are unremarkable. No evidence of intracranial hemorrhage on SWI views. The orbits and their contents, paranasal sinuses and calvarium are unremarkable.  Intracranial flow voids are present.     IMPRESSION:    Unremarkable MRI brain (with and without). No acute findings.   PATIENT SURVEYS:  FOTO Initial 58, Current 22   COGNITION: Overall cognitive status: Within functional limits for tasks assessed   SENSATION: WFL  POSTURE:  Slight forward head and rounded shoulders  PALPATION: Mild cervical spine tenderness   CERVICAL ROM:   Active ROM A/PROM (deg) 11/09/2021 AROM 11/30/21  Flexion wfl 50 degrees  Extension Limited by 20% 50 degrees  Right lateral flexion Limited by 20% 34 degrees  Left lateral flexion Limited by 20% 42 degrees  Right rotation Limted by 20%  52 degrees  Left rotation Limited by 20% 60 degrees   (Blank rows = not tested)  Patient reports twinge with right and left rotation with extensions as well as pure neck extension  UE MMT:  MMT Right 11/09/2021 Left 11/09/2021  Shoulder flexion 5 5  Shoulder extension    Shoulder abduction 5 5  Shoulder adduction    Shoulder extension    Shoulder internal rotation 5 5  Shoulder external rotation 4 4  Middle trapezius    Lower trapezius    Elbow flexion 5 5  Elbow extension 4+ 4+  Wrist flexion    Wrist extension    Wrist ulnar deviation    Wrist radial deviation    Wrist pronation    Wrist supination    Grip strength     (Blank rows = not tested)  CERVICAL SPECIAL TESTS:  Distraction test: Positive decreased pain with distraction    PATIENT SURVEYS:  FOTO 58 FOTO Initial 58, Current 66  TODAY'S TREATMENT:  12/05/2021 UBE x 3 backwards  STM at sub occipitals and upper trap x 10' Manual cervical traction 5" hold  x 10 No other treatment performed in conjunction with manual  treatment  Seated chin tucks x 10 Seated chin tucks x 10 with overpressure  Seated Mid-Lower Cervical Extension SNAG with Strap  -  1 sets - 10 reps  Prone cervical and thoracic extensions x 10       11/30/21 Reassessment STM at sub occipitals and upper trap NAGS for upper cervical spine as per [place] distraction hang with 1# weight for headaches as per Janyce Llanos concept Fist traction for flexion as per Janyce Llanos concept 15 sec x3   11/23/21 Interlocking fingers posterior to neck, incline with distraction as per Janyce Llanos concept20 sec hold x4 Fist traction for flexion as per Janyce Llanos concept 15 sec x3 NAGS for upper cervical spine as per Janyce Llanos concept Chin tuck with rotation x10 b/l  Chin tuck with rotation and manual overpressure x5 b/l  Chin tuck with rotation in supine with tennis balls posterior x10 b/l  Chin tuck with neck flexion iso hold x10 seconds x5 reps  DN cervical region with mini fasciculations in areas where DN performed (performed by Marina Gravel PT)      11/20/21 Chin tuck x 20 Cervical retraction - extension 2x 10  Cervical retraction - extension - rotation 2x 10  Cervical retraction isometric at wall 2x 10 5 second holds Horizontal abduction blue band 2x 10 with cervical retraction Scap retraction with bilateral GH ER blue band 2x 10 with cervical retraction  TTP bilateral suboccipitals with symptom reproduction    11/17/21 -Seated upper trap and leavtor stretches 15 sec hold x2 each  -Contract relax for lateral neck flexion and neck extension x5 reps each -STM occipital and upper trap region -Manual cervical distraction -Theraband resisted neck extension with chin tuck, and lateral resisted flexions -Tspine seated manipulation  11/13/21 Upper trap and levator seated stretch x3 each b/l 10 sec holds STM occipital/cervical and upper trap area x15 mins Cervical manual distraction x3 mins Self SNAGS for Extension and rotation x6  each Supine chin tuck with elevation x10 Quadruped chin tuck with neck flexion and extension x5 Quadruped scap plus 1 with neck extension with green tband x10    Physical therapy evaluation and HEP instruction, postural education Manual traction 5" hold x 10 in supine   PATIENT EDUCATION:  Education details: updated HEP Person educated: Patient Education method: Consulting civil engineer, Demonstration, and Handouts Education comprehension: verbalized understanding, returned demonstration, and needs further education   HOME EXERCISE PROGRAM:  12/05/21- Mid-Lower Cervical Extension SNAG with Strap  - 3 x daily - 7 x weekly - 1 sets - 10 reps Access Code: [email or web address] URL: [email or web address] Date: 11/09/2021 Prepared by: [facility] - Rehab  Exercises - Seated Cervical Retraction  - 1 x daily - 7 x weekly - 1 sets - 10 reps - Supine Chin Tuck  - 1 x daily - 7 x weekly - 1 sets - 10 reps - Supine Passive Cervical Retraction  - 1 x daily - 7 x weekly - 1 sets - 10 reps  Access Code: [email or web address] URL: [email or web address] Date: 11/13/2021 Prepared by: Leota Jacobsen  Exercises - Seated Upper Trapezius Stretch  - 2 x daily - 7 x weekly - 1 sets - 10 reps - 10 hold - Seated Levator Scapulae Stretch  - 2 x daily - 7 x weekly - 1 sets - 10 reps - 10 hold - Quadruped Cervical Flexion and Extension  - 1 x daily - 7 x weekly - 3 sets - 10 reps - 2  hold -Quadruped scap plus 1 with resisted neck extensions 1 daily- 7x weekly - 3 sets- 10 reps - 2hold 11/20/21 - Seated Cervical Retraction and Extension  - 1 x daily - 7 x weekly - 2 sets - 10 reps  ASSESSMENT:  CLINICAL IMPRESSION: Today's session continued to focus on decreasing pain and increasing cervical mobility and postural strength.  Patient continues with slight forward head and rounded shoulders when relaxing while sitting.  Added Snag with towel roll to HEP; added prone cervical and thoracic extension to improve cervical  mobility and increase strength of neck extensors.  Patient will continue to benefit from skilled PT services to work towards continued decrease in headache and improvement in neck rom.     OBJECTIVE IMPAIRMENTS decreased activity tolerance, decreased endurance, decreased knowledge of condition, decreased mobility, decreased ROM, hypomobility, increased fascial restrictions, postural dysfunction, and pain.   ACTIVITY LIMITATIONS cleaning, community activity, driving, meal prep, occupation, laundry, medication management, yard work, and shopping.   PERSONAL FACTORS Profession are also affecting patient's functional outcome.    REHAB POTENTIAL: Good  CLINICAL DECISION MAKING: Stable/uncomplicated  EVALUATION COMPLEXITY: Low   GOALS: Goals reviewed with patient? No  SHORT TERM GOALS: Target date: 11/16/2021     patient will be independent with initial HEP Baseline:  Goal status: MET  Masters TERM GOALS: Target date: 12/24/2021 Patient will be independent with advanced HEP and self management strategies to improve quality of life and functional outcomes.  Baseline:  Goal status: IN PROGRESS  2.  Patient will be able to demonstrate pain free cervical motion in all tested motions/directions (flexion/extension/SB R/SB L/ ROT R/ ROT L)  Baseline: Patient reports twinge with right and left rotation with extensions as well as pure neck extension Goal status: IN PROGRESS  3.  Patient will work all day without headache pain greater than 2/10 to improve work efficiency Baseline: 3/10 Goal status: IN PROGRESS  4.   Patient will improve FOTO score to predicted value to demonstrate improved functional mobility.  Baseline: 58 Current: 66 Goal status:ongoing  5.  Patient will report at least 75% improvement in overall symptoms and/or function to demonstrate improved functional mobility   Baseline: Current: 40 % improvement  Goal status: IN PROGRESS   PLAN: PT FREQUENCY: 2x/week  PT  DURATION: 3 weeks  PLANNED INTERVENTIONS: Therapeutic exercises, Therapeutic activity, Neuromuscular re-education, Balance training, Gait training, Patient/Family education, Joint manipulation, Joint mobilization, Stair training, Vestibular training, Canalith repositioning, DME instructions, Aquatic Therapy, Dry Needling, Electrical stimulation, Spinal manipulation, Spinal mobilization, Cryotherapy, Moist heat, Manual lymph drainage, scar mobilization, Splintting, Taping, Traction, Ultrasound, and Manual therapy  PLAN FOR NEXT SESSION: continue to incorporate cervical retraction with postural exercises and neck movements. Based on pt response continue with distraction and NAGS.   10:31 AM, 12/05/21 Cordarius Benning Small Hammond Obeirne MPT [facility] [place] [phone]

## 2021-12-05 NOTE — Progress Notes (Deleted)
Referring Provider: Samuella Bruin Primary Care Physician:  Samuella Bruin Primary Gastroenterologist:  Dr. Jena Gauss  No chief complaint on file.   HPI:   Tony Stephens is a 36 y.o. male presenting today at the request of Shawnie Dapper, PA-C for nausea, vomiting, weight loss, headache. 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 He has also been referred to physical therapy for cervicalgia.   Today:      Past Medical History:  Diagnosis Date   GERD (gastroesophageal reflux disease)    Headache    Hypertension     Past Surgical History:  Procedure Laterality Date   arm surgery Left    TESTICLE TORSION REDUCTION      Current Outpatient Medications  Medication Sig Dispense Refill   ALPRAZolam (XANAX) 0.5 MG tablet Take 0.5 mg by mouth 3 (three) times daily as needed.     amitriptyline (ELAVIL) 25 MG tablet Take 1/2 pill at bedtime for one week, then increase to 1 pill at bedtime 30 tablet 3   methocarbamol (ROBAXIN) 500 MG tablet Take 1 tablet (500 mg total) by mouth every 8 (eight) hours as needed for muscle spasms. 60 tablet 3   methylPREDNISolone (MEDROL DOSEPAK) 4 MG TBPK tablet Take as directed by packaging 1 each 0   naproxen (NAPROSYN) 500 MG tablet Take 1 tablet (500 mg total) by mouth 2 (two) times daily. 30 tablet 0   omeprazole (PRILOSEC) 20 MG capsule Take 20 mg by mouth daily.     No current facility-administered medications for this visit.    Allergies as of 12/07/2021   (No Known Allergies)    Family History  Problem Relation Age of Onset   Diabetes Father    Hypertension Father     Social History   Socioeconomic History   Marital status: Married    Spouse name: Amy   Number of children: 2   Years of education: Not on file   Highest education level: Some college, no degree  Occupational History   Not on file  Tobacco Use   Smoking status: Never   Smokeless tobacco: Never  Substance and Sexual Activity   Alcohol  use: No   Drug use: No   Sexual activity: Not on file  Other Topics Concern   Not on file  Social History Narrative   Lives with family   Caffeine- maybe 1 soda a few days a week   Social Determinants of Health   Financial Resource Strain: Not on file  Food Insecurity: Not on file  Transportation Needs: Not on file  Physical Activity: Not on file  Stress: Not on file  Social Connections: Not on file  Intimate Partner Violence: Not on file    Review of Systems: Gen: Denies any fever, chills, cold or flulike symptoms, presyncope, syncope. CV: Denies chest pain, heart palpitations.  Resp: Denies shortness of breath, cough.  GI: See HPI GU : Denies urinary burning, urinary frequency, urinary hesitancy MS: Denies joint pain. Derm: Denies rash. Psych: Denies depression, anxiety. Heme: See HPI  Physical Exam: There were no vitals taken for this visit. General:   Alert and oriented. Pleasant and cooperative. Well-nourished and well-developed.  Head:  Normocephalic and atraumatic. Eyes:  Without icterus, sclera clear and conjunctiva pink.  Ears:  Normal auditory acuity. Lungs:  Clear to auscultation bilaterally. No wheezes, rales, or rhonchi. No distress.  Heart:  S1, S2 present without murmurs appreciated.  Abdomen:  +BS, soft, non-tender and  non-distended. No HSM noted. No guarding or rebound. No masses appreciated.  Rectal:  Deferred  Msk:  Symmetrical without gross deformities. Normal posture. Extremities:  Without edema. Neurologic:  Alert and  oriented x4;  grossly normal neurologically. Skin:  Intact without significant lesions or rashes. Psych:  Normal mood and affect.    Assessment:     Plan:  ***   Ermalinda Memos, PA-C [facility] 12/07/2021

## 2021-12-07 ENCOUNTER — Encounter (HOSPITAL_COMMUNITY): Payer: PRIVATE HEALTH INSURANCE | Admitting: Physical Therapy

## 2021-12-07 ENCOUNTER — Ambulatory Visit (HOSPITAL_COMMUNITY): Payer: PRIVATE HEALTH INSURANCE | Admitting: Physical Therapy

## 2021-12-07 ENCOUNTER — Ambulatory Visit: Payer: PRIVATE HEALTH INSURANCE | Admitting: Gastroenterology

## 2021-12-13 ENCOUNTER — Encounter (HOSPITAL_COMMUNITY): Payer: PRIVATE HEALTH INSURANCE

## 2021-12-15 ENCOUNTER — Ambulatory Visit (HOSPITAL_COMMUNITY): Payer: PRIVATE HEALTH INSURANCE | Admitting: Physical Therapy

## 2021-12-15 DIAGNOSIS — M542 Cervicalgia: Secondary | ICD-10-CM

## 2021-12-15 DIAGNOSIS — G4486 Cervicogenic headache: Secondary | ICD-10-CM

## 2021-12-15 NOTE — Therapy (Signed)
OUTPATIENT PHYSICAL THERAPY CERVICAL TREATMENT   Patient Name: Tony Stephens MRN: [id number] DOB:1986-05-17, 36 y.o., male Today's Date: 12/15/2021   PT End of Session - 12/15/21 0829     Visit Number 8    Number of Visits 12    Date for PT Re-Evaluation 12/24/21    Authorization Type [employer], 60 visit limit, no co-pay, no auth    Authorization - Visit Number 8    Authorization - Number of Visits 60    Progress Note Due on Visit 12    PT Start Time 0830    PT Stop Time 0910    PT Time Calculation (min) 40 min    Activity Tolerance Patient tolerated treatment well    Behavior During Therapy WFL for tasks assessed/performed               Past Medical History:  Diagnosis Date   GERD (gastroesophageal reflux disease)    Headache    Hypertension    Past Surgical History:  Procedure Laterality Date   arm surgery Left    TESTICLE TORSION REDUCTION     There are no problems to display for this patient.   PCP: [place] PROVIDER: Genia Harold, MD  REFERRING DIAG: M54.2 (ICD-10-CM) - Cervicalgia   THERAPY DIAG:  Neck pain  Cervicogenic headache  ONSET DATE: Jan 2023  SUBJECTIVE:                                                                                                                                                                                                         SUBJECTIVE STATEMENT:   Pt states that his headaches are doing much better.  Monday and Tuesday I didn't have any pain.  First time in 6 months. PERTINENT HISTORY:  Hx of low and mid back pain as well.  No surgical hx or injuries  PAIN:  Are you having pain? Yes: NPRS scale: 2/10 Pain location: Average headache 3/10 pain. neck, head, shoulders and sometimes chest Pain description: stiffness, headaches Aggravating factors: worse as the day progresses Relieving factors: ice, medication   PATIENT GOALS  have less pain and stiffness in neck  OBJECTIVE:   DIAGNOSTIC  FINDINGS:  CLINICAL DATA:  Low back pain; unspecified back pain laterality, unspecified chronicity, unspecified whether sciatica present. Lower back pain for 1 month.   EXAM: LUMBAR SPINE - 2-3 VIEW   COMPARISON:  X-ray lumbar 10/01/2016.   FINDINGS: There is no evidence of lumbar spine fracture. Alignment is normal. Mild degenerative disc disease is noted at L5-S1.  IMPRESSION: Mild degenerative disc disease is noted at L5-S1. No acute abnormality is seen. CLINICAL DATA:  Thoracic back pain for 1 month.   EXAM: THORACIC SPINE - 3 VIEWS   COMPARISON:  None.   FINDINGS: There is no evidence of thoracic spine fracture. Alignment is normal. No other significant bone abnormalities are identified.   IMPRESSION: Negative.  [facility]   NEUROIMAGING REPORT     STUDY DATE: 09/19/21 PATIENT NAME: Tony Stephens DOB: [birth year] MRN: [id number]   ORDERING CLINICIAN: Genia Harold, MD  CLINICAL HISTORY: 36 year old male with headaches.   EXAM: MR BRAIN W WO CONTRAST  TECHNIQUE: MRI of the brain with and without contrast was obtained utilizing multiplanar, multiecho pulse sequences. CONTRAST: 28m multihance  COMPARISON: 07/28/21   IMAGING SITE: [facility] [place]       FINDINGS:    No abnormal lesions are seen on diffusion-weighted views to suggest acute ischemia. The cortical sulci, fissures and cisterns are normal in size and appearance. Lateral, third and fourth ventricle are normal in size and appearance. No extra-axial fluid collections are seen. No evidence of mass effect or midline shift.  Few punctate foci of nonspecific T2 hyperintensities in the subcortical white matter.  No abnormal lesions are seen on post contrast views.     On sagittal views the posterior fossa, pituitary gland and corpus callosum are unremarkable. No evidence of intracranial hemorrhage on SWI views. The orbits and their contents, paranasal  sinuses and calvarium are unremarkable.  Intracranial flow voids are present.     IMPRESSION:    Unremarkable MRI brain (with and without). No acute findings.   PATIENT SURVEYS:  FOTO Initial 58, Current 66    CERVICAL ROM:   Active ROM A/PROM (deg) 11/09/2021 AROM 11/30/21  Flexion wfl 50 degrees  Extension Limited by 20% 50 degrees  Right lateral flexion Limited by 20% 34 degrees  Left lateral flexion Limited by 20% 42 degrees  Right rotation Limted by 20%  52 degrees  Left rotation Limited by 20% 60 degrees   (Blank rows = not tested)  Patient reports twinge with right and left rotation with extensions as well as pure neck extension  UE MMT:  MMT Right 11/09/2021 Left 11/09/2021  Shoulder flexion 5 5  Shoulder extension    Shoulder abduction 5 5  Shoulder adduction    Shoulder extension    Shoulder internal rotation 5 5  Shoulder external rotation 4 4  Middle trapezius    Lower trapezius    Elbow flexion 5 5  Elbow extension 4+ 4+  Wrist flexion    Wrist extension    Wrist ulnar deviation    Wrist radial deviation    Wrist pronation    Wrist supination    Grip strength     (Blank rows = not tested)  CERVICAL SPECIAL TESTS:  Distraction test: Positive decreased pain with distraction    PATIENT SURVEYS:  FOTO 58 FOTO Initial 58, Current 66  TODAY'S TREATMENT:  6/15: Standing Postural using green theraband            Scapular retraction x 10             Rows x 10            Sittting:             W-back: 2# x 10            X to V x 10  Trap stretch 3 x 20"            Cervical and thoracic  excursion x 3            Prone:  cervical retraction x 10                        Rows x 10 with 2#             Manual:  suboccipital release, cervical traction to reduce Headache and cervical pain  12/05/2021 UBE x 3 backwards  STM at sub occipitals and upper trap x 10' Manual cervical traction 5" hold x 10 No other treatment performed in conjunction  with manual treatment  Seated chin tucks x 10 Seated chin tucks x 10 with overpressure  Seated Mid-Lower Cervical Extension SNAG with Strap  -  1 sets - 10 reps  Prone cervical and thoracic extensions x 10       11/30/21 Reassessment STM at sub occipitals and upper trap NAGS for upper cervical spine as per Janyce Llanos concept Belt distraction hang with 1# weight for headaches as per Janyce Llanos concept Fist traction for flexion as per Janyce Llanos concept 15 sec x3   11/23/21 Interlocking fingers posterior to neck, incline with distraction as per Janyce Llanos concept20 sec hold x4 Fist traction for flexion as per Janyce Llanos concept 15 sec x3 NAGS for upper cervical spine as per Janyce Llanos concept Chin tuck with rotation x10 b/l  Chin tuck with rotation and manual overpressure x5 b/l  Chin tuck with rotation in supine with tennis balls posterior x10 b/l  Chin tuck with neck flexion iso hold x10 seconds x5 reps  DN cervical region with mini fasciculations in areas where DN performed (performed by Marina Gravel PT)       Physical therapy evaluation and HEP instruction, postural education Manual traction 5" hold x 10 in supine   PATIENT EDUCATION:  Education details: updated HEP Person educated: Patient Education method: Explanation, Demonstration, and Handouts Education comprehension: verbalized understanding, returned demonstration, and needs further education   HOME EXERCISE PROGRAM: 6/15::  Cervical and thoracic excursions. 12/05/21- Mid-Lower Cervical Extension SNAG with Strap  - 3 x daily - 7 x weekly - 1 sets - 10 reps Access Code: [email or web address] URL: [email or web address] Date: 11/09/2021 Prepared by: [facility] - Rehab  Exercises - Seated Cervical Retraction  - 1 x daily - 7 x weekly - 1 sets - 10 reps - Supine Chin Tuck  - 1 x daily - 7 x weekly - 1 sets - 10 reps - Supine Passive Cervical Retraction  - 1 x daily - 7 x weekly - 1 sets - 10 reps  Access Code: [email or web address] URL:  [email or web address] Date: 11/13/2021 Prepared by: Leota Jacobsen  Exercises - Seated Upper Trapezius Stretch  - 2 x daily - 7 x weekly - 1 sets - 10 reps - 10 hold - Seated Levator Scapulae Stretch  - 2 x daily - 7 x weekly - 1 sets - 10 reps - 10 hold - Quadruped Cervical Flexion and Extension  - 1 x daily - 7 x weekly - 3 sets - 10 reps - 2 hold -Quadruped scap plus 1 with resisted neck extensions 1 daily- 7x weekly - 3 sets- 10 reps - 2hold 11/20/21 - Seated Cervical Retraction and Extension  - 1 x daily - 7 x weekly - 2 sets - 10 reps  ASSESSMENT:  CLINICAL IMPRESSION: Pt session focused on posture, cervical stability as well  as stretching.  Pt very tight in suboccipital area but responds well to manual.  PT will continue to benefit from skilled PT to reduce cervical pain.   OBJECTIVE IMPAIRMENTS decreased activity tolerance, decreased endurance, decreased knowledge of condition, decreased mobility, decreased ROM, hypomobility, increased fascial restrictions, postural dysfunction, and pain.   ACTIVITY LIMITATIONS cleaning, community activity, driving, meal prep, occupation, laundry, medication management, yard work, and shopping.   PERSONAL FACTORS Profession are also affecting patient's functional outcome.    REHAB POTENTIAL: Good  CLINICAL DECISION MAKING: Stable/uncomplicated  EVALUATION COMPLEXITY: Low   GOALS: Goals reviewed with patient? No  SHORT TERM GOALS: Target date: 11/16/2021     patient will be independent with initial HEP Baseline:  Goal status: MET  [place] TERM GOALS: Target date: 12/24/2021 Patient will be independent with advanced HEP and self management strategies to improve quality of life and functional outcomes.  Baseline:  Goal status: IN PROGRESS  2.  Patient will be able to demonstrate pain free cervical motion in all tested motions/directions (flexion/extension/SB R/SB L/ ROT R/ ROT L)  Baseline: Patient reports twinge with  right and left rotation with extensions as well as pure neck extension Goal status: IN PROGRESS  3.  Patient will work all day without headache pain greater than 2/10 to improve work efficiency Baseline: 3/10 Goal status: IN PROGRESS  4.   Patient will improve FOTO score to predicted value to demonstrate improved functional mobility.  Baseline: 58 Current: 66 Goal status:ongoing  5.  Patient will report at least 75% improvement in overall symptoms and/or function to demonstrate improved functional mobility   Baseline: Current: 40 % improvement  Goal status: IN PROGRESS   PLAN: PT FREQUENCY: 2x/week  PT DURATION: 3 weeks  PLANNED INTERVENTIONS: Therapeutic exercises, Therapeutic activity, Neuromuscular re-education, Balance training, Gait training, Patient/Family education, Joint manipulation, Joint mobilization, Stair training, Vestibular training, Canalith repositioning, DME instructions, Aquatic Therapy, Dry Needling, Electrical stimulation, Spinal manipulation, Spinal mobilization, Cryotherapy, Moist heat, Manual lymph drainage, scar mobilization, Splintting, Taping, Traction, Ultrasound, and Manual therapy  PLAN FOR NEXT SESSION: continue to incorporate cervical retraction with postural exercises and neck movements. continue with distraction and NAGS.   8:32 AM, 12/15/21             Rayetta Humphrey, PT CLT              [phone]

## 2021-12-19 ENCOUNTER — Encounter (HOSPITAL_COMMUNITY): Payer: Self-pay

## 2021-12-19 ENCOUNTER — Ambulatory Visit (HOSPITAL_COMMUNITY): Payer: PRIVATE HEALTH INSURANCE | Attending: Physician Assistant

## 2021-12-19 DIAGNOSIS — G4486 Cervicogenic headache: Secondary | ICD-10-CM | POA: Insufficient documentation

## 2021-12-19 DIAGNOSIS — M542 Cervicalgia: Secondary | ICD-10-CM | POA: Insufficient documentation

## 2021-12-19 NOTE — Therapy (Signed)
OUTPATIENT PHYSICAL THERAPY CERVICAL TREATMENT   Patient Name: Tony Stephens MRN: [id number] DOB:06/14/1986, 36 y.o., male Today's Date: 12/19/2021   PT End of Session - 12/19/21 0823     Visit Number 9    Number of Visits 12    Date for PT Re-Evaluation 12/24/21    Authorization Type [employer], 60 visit limit, no co-pay, no auth    Authorization - Visit Number 9    Authorization - Number of Visits 60    Progress Note Due on Visit 12    PT Start Time 0822    PT Stop Time 0900    PT Time Calculation (min) 38 min    Activity Tolerance Patient tolerated treatment well    Behavior During Therapy WFL for tasks assessed/performed               Past Medical History:  Diagnosis Date   GERD (gastroesophageal reflux disease)    Headache    Hypertension    Past Surgical History:  Procedure Laterality Date   arm surgery Left    TESTICLE TORSION REDUCTION     There are no problems to display for this patient.   PCP: [place] PROVIDER: Genia Harold, MD  REFERRING DIAG: M54.2 (ICD-10-CM) - Cervicalgia   THERAPY DIAG:  Neck pain  Cervicogenic headache  ONSET DATE: Jan 2023  SUBJECTIVE:                                                                                                                                                                                                         SUBJECTIVE STATEMENT:   patient reports he is doing well; minimal pain today.  Sleeping well   PERTINENT HISTORY:  Hx of low and mid back pain as well.  No surgical hx or injuries  PAIN:  Are you having pain? Yes: NPRS scale: 1/10 Pain location: Average headache 3/10 pain. neck, head, shoulders and sometimes chest Pain description: stiffness, headaches Aggravating factors: worse as the day progresses Relieving factors: ice, medication   PATIENT GOALS  have less pain and stiffness in neck  OBJECTIVE:   DIAGNOSTIC FINDINGS:  CLINICAL DATA:  Low back pain;  unspecified back pain laterality, unspecified chronicity, unspecified whether sciatica present. Lower back pain for 1 month.   EXAM: LUMBAR SPINE - 2-3 VIEW   COMPARISON:  X-ray lumbar 10/01/2016.   FINDINGS: There is no evidence of lumbar spine fracture. Alignment is normal. Mild degenerative disc disease is noted at L5-S1.   IMPRESSION: Mild degenerative disc disease is noted at L5-S1. No  acute abnormality is seen. CLINICAL DATA:  Thoracic back pain for 1 month.   EXAM: THORACIC SPINE - 3 VIEWS   COMPARISON:  None.   FINDINGS: There is no evidence of thoracic spine fracture. Alignment is normal. No other significant bone abnormalities are identified.   IMPRESSION: Negative.  [facility]   NEUROIMAGING REPORT     STUDY DATE: 09/19/21 PATIENT NAME: Tony Stephens DOB: May 05, 1986 MRN: [id number]   ORDERING CLINICIAN: Genia Harold, MD  CLINICAL HISTORY: 36 year old male with headaches.   EXAM: MR BRAIN W WO CONTRAST  TECHNIQUE: MRI of the brain with and without contrast was obtained utilizing multiplanar, multiecho pulse sequences. CONTRAST: 20m multihance  COMPARISON: 07/28/21   IMAGING SITE: [facility] [place]       FINDINGS:    No abnormal lesions are seen on diffusion-weighted views to suggest acute ischemia. The cortical sulci, fissures and cisterns are normal in size and appearance. Lateral, third and fourth ventricle are normal in size and appearance. No extra-axial fluid collections are seen. No evidence of mass effect or midline shift.  Few punctate foci of nonspecific T2 hyperintensities in the subcortical white matter.  No abnormal lesions are seen on post contrast views.     On sagittal views the posterior fossa, pituitary gland and corpus callosum are unremarkable. No evidence of intracranial hemorrhage on SWI views. The orbits and their contents, paranasal sinuses and calvarium are unremarkable.   Intracranial flow voids are present.     IMPRESSION:    Unremarkable MRI brain (with and without). No acute findings.   PATIENT SURVEYS:  FOTO Initial 58, Current 66    CERVICAL ROM:   Active ROM A/PROM (deg) 11/09/2021 AROM 11/30/21  Flexion wfl 50 degrees  Extension Limited by 20% 50 degrees  Right lateral flexion Limited by 20% 34 degrees  Left lateral flexion Limited by 20% 42 degrees  Right rotation Limted by 20%  52 degrees  Left rotation Limited by 20% 60 degrees   (Blank rows = not tested)  Patient reports twinge with right and left rotation with extensions as well as pure neck extension  UE MMT:  MMT Right 11/09/2021 Left 11/09/2021  Shoulder flexion 5 5  Shoulder extension    Shoulder abduction 5 5  Shoulder adduction    Shoulder extension    Shoulder internal rotation 5 5  Shoulder external rotation 4 4  Middle trapezius    Lower trapezius    Elbow flexion 5 5  Elbow extension 4+ 4+  Wrist flexion    Wrist extension    Wrist ulnar deviation    Wrist radial deviation    Wrist pronation    Wrist supination    Grip strength     (Blank rows = not tested)  CERVICAL SPECIAL TESTS:  Distraction test: Positive decreased pain with distraction    PATIENT SURVEYS:  FOTO 58 FOTO Initial 58, Current 66  TODAY'S TREATMENT:   6/19: UBE x 3 backwards Standing: Postural using blue theraband  Scapular retraction x 10 Rows x 10 Sitting: W-back 2# x 10 Trap stretch 3 x 20"  X to V x 10  Prone:  Cervical retraction x 10   Rows x 10  Y x 10  Supine:  Shoulder horizontal abduction on a half foam roll with blue theraband x10  PNF D2 with blue theraband x 10  Manual:  Cervical traction to reduce headache and cervical pain   Suboccipital release  6/15: Standing Postural using green theraband            Scapular retraction x 10             Rows x 10            Sittting:             W-back: 2# x 10            X to V x 10            Trap  stretch 3 x 20"            Cervical and thoracic  excursion x 3            Prone:  cervical retraction x 10                        Rows x 10 with 2#  Manual:  suboccipital release, cervical traction to reduce Headache and cervical pain  12/05/2021 UBE x 3 backwards  STM at sub occipitals and upper trap x 10' Manual cervical traction 5" hold x 10 No other treatment performed in conjunction with manual treatment  Seated chin tucks x 10 Seated chin tucks x 10 with overpressure  Seated Mid-Lower Cervical Extension SNAG with Strap  -  1 sets - 10 reps  Prone cervical and thoracic extensions x 10     PATIENT EDUCATION:  Education details: updated HEP Person educated: Patient Education method: Explanation, Demonstration, and Handouts Education comprehension: verbalized understanding, returned demonstration, and needs further education   HOME EXERCISE PROGRAM: 6/15::  Cervical and thoracic excursions. 12/05/21- Mid-Lower Cervical Extension SNAG with Strap  - 3 x daily - 7 x weekly - 1 sets - 10 reps Access Code: [email or web address] URL: [email or web address] Date: 11/09/2021 Prepared by: [facility] - Rehab  Exercises - Seated Cervical Retraction  - 1 x daily - 7 x weekly - 1 sets - 10 reps - Supine Chin Tuck  - 1 x daily - 7 x weekly - 1 sets - 10 reps - Supine Passive Cervical Retraction  - 1 x daily - 7 x weekly - 1 sets - 10 reps  Access Code: [email or web address] URL: [email or web address] Date: 11/13/2021 Prepared by: Leota Jacobsen  Exercises - Seated Upper Trapezius Stretch  - 2 x daily - 7 x weekly - 1 sets - 10 reps - 10 hold - Seated Levator Scapulae Stretch  - 2 x daily - 7 x weekly - 1 sets - 10 reps - 10 hold - Quadruped Cervical Flexion and Extension  - 1 x daily - 7 x weekly - 3 sets - 10 reps - 2 hold -Quadruped scap plus 1 with resisted neck extensions 1 daily- 7x weekly - 3 sets- 10 reps - 2hold 11/20/21 - Seated Cervical Retraction and Extension  - 1 x daily -  7 x weekly - 2 sets - 10 reps  ASSESSMENT:  CLINICAL IMPRESSION: Today's session continued to focus on postural strengthening; decreasing cervical tightness to relieve headache pain.  Patient tolerates progressing postural exercise well; increased Thera band intensity to blue today; green not challenging during rows and extensions first set. issued theraband for home use. Patient will benefit from continued skilled therapy interventions to address deficits and improve functional mobility.   OBJECTIVE IMPAIRMENTS decreased activity tolerance, decreased endurance, decreased knowledge of condition, decreased mobility, decreased ROM, hypomobility, increased fascial restrictions, postural dysfunction, and pain.   ACTIVITY LIMITATIONS cleaning, community activity, driving, meal  prep, occupation, laundry, medication management, yard work, and shopping.   PERSONAL FACTORS Profession are also affecting patient's functional outcome.    REHAB POTENTIAL: Good  CLINICAL DECISION MAKING: Stable/uncomplicated  EVALUATION COMPLEXITY: Low   GOALS: Goals reviewed with patient? No  SHORT TERM GOALS: Target date: 11/16/2021     patient will be independent with initial HEP Baseline:  Goal status: MET  [place] TERM GOALS: Target date: 12/24/2021 Patient will be independent with advanced HEP and self management strategies to improve quality of life and functional outcomes.  Baseline:  Goal status: IN PROGRESS  2.  Patient will be able to demonstrate pain free cervical motion in all tested motions/directions (flexion/extension/SB R/SB L/ ROT R/ ROT L)  Baseline: Patient reports twinge with right and left rotation with extensions as well as pure neck extension Goal status: IN PROGRESS  3.  Patient will work all day without headache pain greater than 2/10 to improve work efficiency Baseline: 3/10 Goal status: IN PROGRESS  4.   Patient will improve FOTO score to predicted value to demonstrate improved  functional mobility.  Baseline: 58 Current: 66 Goal status:ongoing  5.  Patient will report at least 75% improvement in overall symptoms and/or function to demonstrate improved functional mobility   Baseline: Current: 40 % improvement  Goal status: IN PROGRESS   PLAN: PT FREQUENCY: 2x/week  PT DURATION: 3 weeks  PLANNED INTERVENTIONS: Therapeutic exercises, Therapeutic activity, Neuromuscular re-education, Balance training, Gait training, Patient/Family education, Joint manipulation, Joint mobilization, Stair training, Vestibular training, Canalith repositioning, DME instructions, Aquatic Therapy, Dry Needling, Electrical stimulation, Spinal manipulation, Spinal mobilization, Cryotherapy, Moist heat, Manual lymph drainage, scar mobilization, Splintting, Taping, Traction, Ultrasound, and Manual therapy  PLAN FOR NEXT SESSION: continue to incorporate cervical retraction with postural exercises and neck movements. continue with distraction and NAGS.      9:03 AM, 12/19/21 Freja Faro Small Emillee Talsma MPT [facility] [place] [phone]

## 2021-12-21 ENCOUNTER — Encounter (HOSPITAL_COMMUNITY): Payer: Self-pay | Admitting: Physical Therapy

## 2021-12-21 ENCOUNTER — Ambulatory Visit (HOSPITAL_COMMUNITY): Payer: PRIVATE HEALTH INSURANCE | Admitting: Physical Therapy

## 2021-12-21 DIAGNOSIS — G4486 Cervicogenic headache: Secondary | ICD-10-CM

## 2021-12-21 DIAGNOSIS — M542 Cervicalgia: Secondary | ICD-10-CM

## 2021-12-21 NOTE — Therapy (Signed)
OUTPATIENT PHYSICAL THERAPY CERVICAL TREATMENT   Patient Name: Tony Stephens MRN: [id number] DOB:[birth year], 36 y.o., male Today's Date: 12/21/2021 PHYSICAL THERAPY DISCHARGE SUMMARY  Visits from Start of Care: 10  Current functional level related to goals / functional outcomes: See below   Remaining deficits: See below   Education / Equipment: See assessment   Patient agrees to discharge. Patient goals were met. Patient is being discharged due to meeting the stated rehab goals.   PT End of Session - 12/21/21 0910     Visit Number 10    Number of Visits 12    Date for PT Re-Evaluation 12/24/21    Authorization Type [employer], 60 visit limit, no co-pay, no auth    Authorization - Visit Number 10    Authorization - Number of Visits 60    Progress Note Due on Visit 12    PT Start Time 0909    PT Stop Time 0940    PT Time Calculation (min) 31 min    Activity Tolerance Patient tolerated treatment well    Behavior During Therapy WFL for tasks assessed/performed               Past Medical History:  Diagnosis Date   GERD (gastroesophageal reflux disease)    Headache    Hypertension    Past Surgical History:  Procedure Laterality Date   arm surgery Left    TESTICLE TORSION REDUCTION     There are no problems to display for this patient.   PCP: [facility] PROVIDER: Genia Harold, MD  REFERRING DIAG: M54.2 (ICD-10-CM) - Cervicalgia   THERAPY DIAG:  Neck pain  Cervicogenic headache  ONSET DATE: Jan 2023  SUBJECTIVE:                                                                                                                                                                                                         SUBJECTIVE STATEMENT:   Patient reports significant improvement since starting therapy. Pain is much better. About 90% improvement overall.   PERTINENT HISTORY:  Hx of low and mid back pain as well.  No surgical hx or  injuries  PAIN:  PAIN:  Are you having pain? No   PATIENT GOALS  have less pain and stiffness in neck  OBJECTIVE:   DIAGNOSTIC FINDINGS:  CLINICAL DATA:  Low back pain; unspecified back pain laterality, unspecified chronicity, unspecified whether sciatica present. Lower back pain for 1 month.   EXAM: LUMBAR SPINE - 2-3 VIEW   COMPARISON:  X-ray lumbar 10/01/2016.  FINDINGS: There is no evidence of lumbar spine fracture. Alignment is normal. Mild degenerative disc disease is noted at L5-S1.   IMPRESSION: Mild degenerative disc disease is noted at L5-S1. No acute abnormality is seen. CLINICAL DATA:  Thoracic back pain for 1 month.   EXAM: THORACIC SPINE - 3 VIEWS   COMPARISON:  None.   FINDINGS: There is no evidence of thoracic spine fracture. Alignment is normal. No other significant bone abnormalities are identified.   IMPRESSION: Negative.  [facility]   NEUROIMAGING REPORT     STUDY DATE: 09/19/21 PATIENT NAME: Tony Stephens DOB: March 11, 1986 MRN: [id number]   ORDERING CLINICIAN: Genia Harold, MD  CLINICAL HISTORY: 36 year old male with headaches.   EXAM: MR BRAIN W WO CONTRAST  TECHNIQUE: MRI of the brain with and without contrast was obtained utilizing multiplanar, multiecho pulse sequences. CONTRAST: 52ml multihance  COMPARISON: 07/28/21   IMAGING SITE: [facility]       FINDINGS:    No abnormal lesions are seen on diffusion-weighted views to suggest acute ischemia. The cortical sulci, fissures and cisterns are normal in size and appearance. Lateral, third and fourth ventricle are normal in size and appearance. No extra-axial fluid collections are seen. No evidence of mass effect or midline shift.  Few punctate foci of nonspecific T2 hyperintensities in the subcortical white matter.  No abnormal lesions are seen on post contrast views.     On sagittal views the posterior fossa, pituitary  gland and corpus callosum are unremarkable. No evidence of intracranial hemorrhage on SWI views. The orbits and their contents, paranasal sinuses and calvarium are unremarkable.  Intracranial flow voids are present.     IMPRESSION:    Unremarkable MRI brain (with and without). No acute findings.   PATIENT SURVEYS:  FOTO Initial 58, Current 66    CERVICAL ROM:   Active ROM A/PROM (deg) 11/09/2021 AROM 11/30/21 AROM 12/21/21  Flexion wfl 50 degrees 52 deg  Extension Limited by 20% 50 degrees 55 deg  Right lateral flexion Limited by 20% 34 degrees 42 deg  Left lateral flexion Limited by 20% 42 degrees 40 deg  Right rotation Limted by 20%  52 degrees 65 deg  Left rotation Limited by 20% 60 degrees 68 deg    (Blank rows = not tested)  Patient reports twinge with right and left rotation with extensions as well as pure neck extension  UE MMT:  MMT Right 11/09/2021 Left 11/09/2021  Shoulder flexion 5 5  Shoulder extension    Shoulder abduction 5 5  Shoulder adduction    Shoulder extension    Shoulder internal rotation 5 5  Shoulder external rotation 4 4  Middle trapezius    Lower trapezius    Elbow flexion 5 5  Elbow extension 4+ 4+  Wrist flexion    Wrist extension    Wrist ulnar deviation    Wrist radial deviation    Wrist pronation    Wrist supination    Grip strength     (Blank rows = not tested)  CERVICAL SPECIAL TESTS:  Distraction test: Positive decreased pain with distraction    PATIENT SURVEYS:  FOTO 58 FOTO Initial 58, Current 70%  TODAY'S TREATMENT:   12/21/21 Reassess FOTO: 70% function AROM HEP review   6/19: UBE x 3 backwards Standing: Postural using blue theraband  Scapular retraction x 10 Rows x 10 Sitting: W-back 2# x 10 Trap stretch 3 x 20"  X to V x 10  Prone:  Cervical retraction x 10   Rows x 10  Y x 10  Supine:  Shoulder horizontal abduction on a half foam roll with blue theraband x10  PNF D2 with blue theraband x  10  Manual:  Cervical traction to reduce headache and cervical pain   Suboccipital release      6/15: Standing Postural using green theraband            Scapular retraction x 10             Rows x 10            Sittting:             W-back: 2# x 10            X to V x 10            Trap stretch 3 x 20"            Cervical and thoracic  excursion x 3            Prone:  cervical retraction x 10                        Rows x 10 with 2#  Manual:  suboccipital release, cervical traction to reduce Headache and cervical pain  12/05/2021 UBE x 3 backwards  STM at sub occipitals and upper trap x 10' Manual cervical traction 5" hold x 10 No other treatment performed in conjunction with manual treatment  Seated chin tucks x 10 Seated chin tucks x 10 with overpressure  Seated Mid-Lower Cervical Extension SNAG with Strap  -  1 sets - 10 reps  Prone cervical and thoracic extensions x 10     PATIENT EDUCATION:  Education details: updated HEP Person educated: Patient Education method: Explanation, Demonstration, and Handouts Education comprehension: verbalized understanding, returned demonstration, and needs further education   HOME EXERCISE PROGRAM:  Access Code: [email or web address] URL: [email or web address]  12/21/21 - Prone Lower Trapezius with Legs Straight on Swiss Ball  - 1 x daily - 3-4 x weekly - 3 sets - 10 reps - Prone Middle Trapezius with Legs Straight on Swiss Ball  - 1 x daily - 3-4 x weekly - 3 sets - 10 reps - Standing Shoulder Row with Anchored Resistance  - 1 x daily - 3-4 x weekly - 3 sets - 10 reps - Shoulder Extension with Resistance  - 1 x daily - 3-4 x weekly - 3 sets - 10 reps  URL: [email or web address] Date: 11/13/2021 Prepared by: Leota Jacobsen  6/15::  Cervical and thoracic excursions.  Exercises - Seated Upper Trapezius Stretch  - 2 x daily - 7 x weekly - 1 sets - 10 reps - 10 hold - Seated Levator Scapulae Stretch  - 2 x  daily - 7 x weekly - 1 sets - 10 reps - 10 hold - Quadruped Cervical Flexion and Extension  - 1 x daily - 7 x weekly - 3 sets - 10 reps - 2 hold -Quadruped scap plus 1 with resisted neck extensions 1 daily- 7x weekly - 3 sets- 10 reps - 2hold  12/05/21-  Mid-Lower Cervical Extension SNAG with Strap  - 3 x daily - 7 x weekly - 1 sets - 10 reps  11/20/21 - Seated Cervical Retraction and Extension  - 1 x daily - 7 x weekly - 2 sets - 10 reps  Date: 11/09/2021  Access Code: [email or web address] Exercises - Seated  Cervical Retraction  - 1 x daily - 7 x weekly - 1 sets - 10 reps - Supine Chin Tuck  - 1 x daily - 7 x weekly - 1 sets - 10 reps - Supine Passive Cervical Retraction  - 1 x daily - 7 x weekly - 1 sets - 10 reps ASSESSMENT:  CLINICAL IMPRESSION: Patient has made good progress and is ready for DC from therapy. Patient reports significant subjective improvement and demos normal cervical AROM. Reviewed comprehensive HEP and updated patient medbridge account. Answered all patient questions. Patient encouraged to follow up with therapy services with any further questions or concerns.   OBJECTIVE IMPAIRMENTS decreased activity tolerance, decreased endurance, decreased knowledge of condition, decreased mobility, decreased ROM, hypomobility, increased fascial restrictions, postural dysfunction, and pain.   ACTIVITY LIMITATIONS cleaning, community activity, driving, meal prep, occupation, laundry, medication management, yard work, and shopping.   PERSONAL FACTORS Profession are also affecting patient's functional outcome.    REHAB POTENTIAL: Good  CLINICAL DECISION MAKING: Stable/uncomplicated  EVALUATION COMPLEXITY: Low   GOALS: Goals reviewed with patient? No  SHORT TERM GOALS: Target date: 11/16/2021     patient will be independent with initial HEP Baseline:  Goal status: MET  Person TERM GOALS: Target date: 12/24/2021 Patient will be independent with advanced HEP and self management  strategies to improve quality of life and functional outcomes.  Baseline: Reviewed an answered all patient questions  Goal status: MET  2.  Patient will be able to demonstrate pain free cervical motion in all tested motions/directions (flexion/extension/SB R/SB L/ ROT R/ ROT L)  Baseline: AROM WFL, slight discomfort with LT rotation at end range  Goal status: MET  3.  Patient will work all day without headache pain greater than 2/10 to improve work efficiency Baseline: Does not have headaches every day, occasionally but when onset intensity is about 2-3/10 Goal status: Partially MET  4.   Patient will improve FOTO score to predicted value to demonstrate improved functional mobility.  Baseline: 58 Current: 70% function Goal status: MET  5.  Patient will report at least 75% improvement in overall symptoms and/or function to demonstrate improved functional mobility  Baseline: Current: Reports 90%  Goal status: MET   PLAN: PT FREQUENCY: 2x/week  PT DURATION: 3 weeks  PLANNED INTERVENTIONS: Therapeutic exercises, Therapeutic activity, Neuromuscular re-education, Balance training, Gait training, Patient/Family education, Joint manipulation, Joint mobilization, Stair training, Vestibular training, Canalith repositioning, DME instructions, Aquatic Therapy, Dry Needling, Electrical stimulation, Spinal manipulation, Spinal mobilization, Cryotherapy, Moist heat, Manual lymph drainage, scar mobilization, Splintting, Taping, Traction, Ultrasound, and Manual therapy  PLAN FOR NEXT SESSION: DC to HEP   10:02 AM, 12/21/21 Josue Hector PT DPT  Physical Therapist with [facility]  [phone]

## 2021-12-27 ENCOUNTER — Ambulatory Visit (INDEPENDENT_AMBULATORY_CARE_PROVIDER_SITE_OTHER): Payer: PRIVATE HEALTH INSURANCE

## 2021-12-27 ENCOUNTER — Encounter: Payer: Self-pay | Admitting: Emergency Medicine

## 2021-12-27 ENCOUNTER — Other Ambulatory Visit: Payer: Self-pay

## 2021-12-27 ENCOUNTER — Ambulatory Visit
Admission: EM | Admit: 2021-12-27 | Discharge: 2021-12-27 | Disposition: A | Discharge status: Home / Self Care | Payer: PRIVATE HEALTH INSURANCE | Attending: Nurse Practitioner | Admitting: Nurse Practitioner

## 2021-12-27 DIAGNOSIS — M25562 Pain in left knee: Secondary | ICD-10-CM | POA: Diagnosis not present

## 2021-12-27 DIAGNOSIS — S8392XA Sprain of unspecified site of left knee, initial encounter: Secondary | ICD-10-CM

## 2021-12-27 MED ORDER — IBUPROFEN 800 MG PO TABS: 800.0000 mg | ORAL_TABLET | Freq: Three times a day (TID) | ORAL | 0 refills | Status: DC

## 2021-12-27 NOTE — Discharge Instructions (Addendum)
Take medication as prescribed. RICE therapy, rest, ice, compression, and elevation while symptoms persist.  Apply ice for 20 minutes, remove for 1 hour, then repeat as needed. Wear the knee brace that is provided for strenuous activity. As discussed, if your symptoms do not improve within the next week, recommend following up with orthopedics.  You can follow-up with [facility] [place] at [phone] or [facility] in [place] at [phone]. Follow-up as needed.

## 2021-12-27 NOTE — ED Triage Notes (Signed)
Pt reports left knee pain since playing basketball last night. Pt reports planted left leg to jump and reports left knee "buckled" and moved inward. Pt reports pain to lateral and medial sides of left knee. Pt able to bear weight but with discomfort. No obvious deformity noted.

## 2021-12-27 NOTE — ED Provider Notes (Signed)
[facility]    CSN: [id number] Arrival date & time: 12/27/21  8250      History   Chief Complaint Chief Complaint  Patient presents with   Knee Injury    HPI Tony Stephens is a 36 y.o. male.   The history is provided by the patient.   Presents for complaints of left knee pain x1 day.  Patient states he was playing basketball last evening, when he went to jump, and planted his left leg, the left knee buckled inward.  Patient presents with pain to the inside and behind the knee.  He reports instability of the left knee, difficulty bearing weight, pain with ambulation, and pain with flexion and extension.  Denies radiation of pain, numbness, tingling, or swelling.  Patient states that he applied ice to the left knee after the injury occurred.  Past Medical History:  Diagnosis Date   GERD (gastroesophageal reflux disease)    Headache    Hypertension     There are no problems to display for this patient.   Past Surgical History:  Procedure Laterality Date   arm surgery Left    TESTICLE TORSION REDUCTION         Home Medications    Prior to Admission medications   Medication Sig Start Date End Date Taking? Authorizing Provider  ibuprofen (ADVIL) 800 MG tablet Take 1 tablet (800 mg total) by mouth 3 (three) times daily. 12/27/21  Yes Mckaylin Bastien-Warren, Sadie Haber, NP  ALPRAZolam Prudy Feeler) 0.5 MG tablet Take 0.5 mg by mouth 3 (three) times daily as needed. 08/01/21   [provider]  amitriptyline (ELAVIL) 25 MG tablet Take 1/2 pill at bedtime for one week, then increase to 1 pill at bedtime 11/16/21   Ocie Doyne, MD  methocarbamol (ROBAXIN) 500 MG tablet Take 1 tablet (500 mg total) by mouth every 8 (eight) hours as needed for muscle spasms. 11/16/21   Ocie Doyne, MD  methylPREDNISolone (MEDROL DOSEPAK) 4 MG TBPK tablet Take as directed by packaging 08/09/21   Ocie Doyne, MD  naproxen (NAPROSYN) 500 MG tablet Take 1 tablet (500 mg total) by mouth 2  (two) times daily. 12/25/19   Wurst, [place], PA-C  omeprazole (PRILOSEC) 20 MG capsule Take 20 mg by mouth daily.    [provider]    Family History Family History  Problem Relation Age of Onset   Diabetes Father    Hypertension Father     Social History Social History   Tobacco Use   Smoking status: Never   Smokeless tobacco: Never  Substance Use Topics   Alcohol use: No   Drug use: No     Allergies   Patient has no known allergies.   Review of Systems Review of Systems Per HPI  Physical Exam Triage Vital Signs ED Triage Vitals [12/27/21 0957]  Enc Vitals Group     BP 122/82     Pulse Rate 61     Resp 18     Temp 98.2 F (36.8 C)     Temp Source Oral     SpO2 98 %     Weight      Height      Head Circumference      Peak Flow      Pain Score 2     Pain Loc      Pain Edu?      Excl. in GC?    No data found.  Updated Vital Signs BP 122/82 (BP Location: Right  Arm)   Pulse 61   Temp 98.2 F (36.8 C) (Oral)   Resp 18   SpO2 98%   Visual Acuity Right Eye Distance:   Left Eye Distance:   Bilateral Distance:    Right Eye Near:   Left Eye Near:    Bilateral Near:     Physical Exam Vitals and nursing note reviewed.  Constitutional:      Appearance: Normal appearance.  Cardiovascular:     Rate and Rhythm: Normal rate and regular rhythm.  Pulmonary:     Effort: Pulmonary effort is normal.     Breath sounds: Normal breath sounds.  Abdominal:     General: Bowel sounds are normal.     Palpations: Abdomen is soft.  Musculoskeletal:     Left knee: No swelling, deformity or crepitus. Normal range of motion. Tenderness present over the medial joint line, MCL and PCL. No patellar tendon tenderness. Normal pulse.     Instability Tests: Anterior drawer test negative. Posterior drawer test negative.     Comments: - Valgus/Varus test  Skin:    General: Skin is warm and dry.  Neurological:     General: No focal deficit present.     Mental  Status: He is alert and oriented to person, place, and time.  Psychiatric:        Mood and Affect: Mood normal.        Behavior: Behavior normal.      UC Treatments / Results  Labs (all labs ordered are listed, but only abnormal results are displayed) Labs Reviewed - No data to display  EKG   Radiology DG Knee Complete 4 Views Left  Result Date: 12/27/2021 CLINICAL DATA:  Left knee pain, twisting injury EXAM: LEFT KNEE - COMPLETE 4+ VIEW COMPARISON:  None Available. FINDINGS: No evidence of fracture or dislocation. Moderate-sized knee joint effusion. No evidence of arthropathy or other focal bone abnormality. Soft tissues are unremarkable. IMPRESSION: Moderate-sized knee joint effusion. No acute fracture or dislocation, left knee. Electronically Signed   By: Duanne Guess D.O.   On: 12/27/2021 10:10    Procedures Procedures (including critical care time)  Medications Ordered in UC Medications - No data to display  Initial Impression / Assessment and Plan / UC Course  I have reviewed the triage vital signs and the nursing notes.  Pertinent labs & imaging results that were available during my care of the patient were reviewed by me and considered in my medical decision making (see chart for details).  Patient presents for complaints of left knee pain after playing basketball last evening.  On exam, he has tenderness to the posterior and medial aspects of the left knee.  Anterior and posterior drawer test are negative.  Patient reports difficulty ambulating.  We will provide a hinged knee brace to provide stability and support.  X-rays are negative for fracture or dislocation.  Discussed with patient that he does have fluid on his knee.  Discussed with patient that it may be more beneficial for him to follow-up with orthopedics for further evaluation.  Patient was given information for Ortho care [place] and [facility] in [place].  Supportive care recommendations were provided  to the patient.  Patient was given a prescription for ibuprofen for pain.  Follow-up as needed. Final Clinical Impressions(s) / UC Diagnoses   Final diagnoses:  Sprain of left knee, unspecified ligament, initial encounter  Acute pain of left knee     Discharge Instructions      Take medication  as prescribed. RICE therapy, rest, ice, compression, and elevation while symptoms persist.  Apply ice for 20 minutes, remove for 1 hour, then repeat as needed. Wear the knee brace that is provided for strenuous activity. As discussed, if your symptoms do not improve within the next week, recommend following up with orthopedics.  You can follow-up with [facility] [place] at [phone] or [facility] in [place] at [phone]. Follow-up as needed.     ED Prescriptions     Medication Sig Dispense Auth. Provider   ibuprofen (ADVIL) 800 MG tablet Take 1 tablet (800 mg total) by mouth 3 (three) times daily. 30 tablet Keera Altidor-Warren, Sadie Haber, NP      PDMP not reviewed this encounter.   Abran Cantor, NP 12/27/21 1031

## 2021-12-31 NOTE — Progress Notes (Deleted)
Referring Provider: Samuella Bruin Primary Care Physician:  Samuella Bruin Primary Gastroenterologist:  Dr. Jena Gauss  No chief complaint on file.   HPI:   Tony Stephens is a 36 y.o. male presenting today at the request of Shawnie Dapper, PA-C for nausea, vomiting, weight loss, and headache. 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 He has also been referred to physical therapy for cervicalgia.   Past Medical History:  Diagnosis Date   GERD (gastroesophageal reflux disease)    Headache    Hypertension     Past Surgical History:  Procedure Laterality Date   arm surgery Left    TESTICLE TORSION REDUCTION      Current Outpatient Medications  Medication Sig Dispense Refill   ALPRAZolam (XANAX) 0.5 MG tablet Take 0.5 mg by mouth 3 (three) times daily as needed.     amitriptyline (ELAVIL) 25 MG tablet Take 1/2 pill at bedtime for one week, then increase to 1 pill at bedtime 30 tablet 3   ibuprofen (ADVIL) 800 MG tablet Take 1 tablet (800 mg total) by mouth 3 (three) times daily. 30 tablet 0   methocarbamol (ROBAXIN) 500 MG tablet Take 1 tablet (500 mg total) by mouth every 8 (eight) hours as needed for muscle spasms. 60 tablet 3   methylPREDNISolone (MEDROL DOSEPAK) 4 MG TBPK tablet Take as directed by packaging 1 each 0   naproxen (NAPROSYN) 500 MG tablet Take 1 tablet (500 mg total) by mouth 2 (two) times daily. 30 tablet 0   omeprazole (PRILOSEC) 20 MG capsule Take 20 mg by mouth daily.     No current facility-administered medications for this visit.    Allergies as of 01/01/2022   (No Known Allergies)    Family History  Problem Relation Age of Onset   Diabetes Father    Hypertension Father     Social History   Socioeconomic History   Marital status: Married    Spouse name: Amy   Number of children: 2   Years of education: Not on file   Highest education level: Some college, no degree  Occupational History   Not on file  Tobacco Use    Smoking status: Never   Smokeless tobacco: Never  Substance and Sexual Activity   Alcohol use: No   Drug use: No   Sexual activity: Not on file  Other Topics Concern   Not on file  Social History Narrative   Lives with family   Caffeine- maybe 1 soda a few days a week   Social Determinants of Health   Financial Resource Strain: Not on file  Food Insecurity: Not on file  Transportation Needs: Not on file  Physical Activity: Not on file  Stress: Not on file  Social Connections: Not on file  Intimate Partner Violence: Not on file    Review of Systems: Gen: Denies any fever, chills, cold or flulike symptoms, presyncope, syncope. CV: Denies chest pain, heart palpitations. Resp: Denies shortness of breath, cough.  GI:  See HPI GU : Denies urinary burning, urinary frequency, urinary hesitancy MS: Denies joint pain. Derm: Denies rash. Psych: Denies depression, anxiety. Heme: See HPI  Physical Exam: There were no vitals taken for this visit. General:   Alert and oriented. Pleasant and cooperative. Well-nourished and well-developed.  Head:  Normocephalic and atraumatic. Eyes:  Without icterus, sclera clear and conjunctiva pink.  Ears:  Normal auditory acuity. Lungs:  Clear to auscultation bilaterally. No wheezes, rales, or rhonchi. No  distress.  Heart:  S1, S2 present without murmurs appreciated.  Abdomen:  +BS, soft, non-tender and non-distended. No HSM noted. No guarding or rebound. No masses appreciated.  Rectal:  Deferred  Msk:  Symmetrical without gross deformities. Normal posture. Extremities:  Without edema. Neurologic:  Alert and  oriented x4;  grossly normal neurologically. Skin:  Intact without significant lesions or rashes. Psych: Normal mood and affect.    Assessment:     Plan:  ***   Ermalinda Memos, PA-C [facility] 01/01/2022

## 2022-01-01 ENCOUNTER — Encounter: Payer: Self-pay | Admitting: Internal Medicine

## 2022-01-01 ENCOUNTER — Ambulatory Visit: Payer: PRIVATE HEALTH INSURANCE | Admitting: Gastroenterology

## 2022-01-04 ENCOUNTER — Encounter: Payer: Self-pay | Admitting: Orthopedic Surgery

## 2022-01-04 ENCOUNTER — Ambulatory Visit (INDEPENDENT_AMBULATORY_CARE_PROVIDER_SITE_OTHER): Payer: PRIVATE HEALTH INSURANCE | Admitting: Orthopedic Surgery

## 2022-01-04 VITALS — BP 144/80 | HR 76 | Ht 68.0 in | Wt 170.0 lb

## 2022-01-04 DIAGNOSIS — M25562 Pain in left knee: Secondary | ICD-10-CM | POA: Diagnosis not present

## 2022-01-04 DIAGNOSIS — S83242A Other tear of medial meniscus, current injury, left knee, initial encounter: Secondary | ICD-10-CM

## 2022-01-04 NOTE — Progress Notes (Signed)
Chief Complaint  Patient presents with   Knee Pain    Left knee since 12/26/21 basketball     HPI: Mr. Schmutz was playing basketball on June 27 he planted to jump his knee gave way he complains of acute.  Medial left knee pain and went to the emergency room.  X-rays were negative.  He had an effusion.  He was placed on ibuprofen and placed in a hinged knee brace.  Presents for evaluation and management.   Past Medical History:  Diagnosis Date   GERD (gastroesophageal reflux disease)    Headache    Hypertension     BP (!) 144/80   Pulse 76   Ht 5\' 8"  (1.727 m)   Wt 170 lb (77.1 kg)   BMI 25.85 kg/m    General appearance: Well-developed well-nourished no gross deformities  Cardiovascular normal pulse and perfusion normal color without edema  Neurologically no sensation loss or deficits or pathologic reflexes  Psychological: Awake alert and oriented x3 mood and affect normal  Skin no lacerations or ulcerations no nodularity no palpable masses, no erythema or nodularity  Musculoskeletal: The patient is ambulatory with no assistive devices other than the hinged knee brace on the left knee.  He has a small effusion with medial joint line tenderness ACL feels intact collateral ligaments feel stable  Imaging I will interpret the images from [facility].  4 views of the left knee no fracture dislocation just effusion  A/P  He probably has a torn medial meniscus  Recommend light duty work for 4 weeks we will reevaluate and possibly proceed to surgery recommend MRI left knee  Encounter Diagnoses  Name Primary?   Acute pain of left knee    Acute medial meniscus tear of left knee, initial encounter Yes   I want to be seen here in follow-up

## 2022-01-04 NOTE — Patient Instructions (Addendum)
While we are working on your approval for MRI please go ahead and call to schedule your appointment with [facility] within at least one (1) week.   Central Scheduling [phone]   Light duty work x 4 weeks

## 2022-01-09 ENCOUNTER — Ambulatory Visit: Payer: PRIVATE HEALTH INSURANCE | Admitting: Orthopaedic Surgery

## 2022-01-09 ENCOUNTER — Ambulatory Visit (HOSPITAL_COMMUNITY): Payer: PRIVATE HEALTH INSURANCE

## 2022-01-11 ENCOUNTER — Ambulatory Visit: Payer: PRIVATE HEALTH INSURANCE | Admitting: Orthopedic Surgery

## 2022-01-18 ENCOUNTER — Telehealth: Payer: Self-pay | Admitting: Orthopedic Surgery

## 2022-01-18 NOTE — Telephone Encounter (Signed)
Voice message received from patient for work note - said would like to return to work if possible while awaiting MRI and insurance process / call returned, reached voice mail, left message

## 2022-01-18 NOTE — Telephone Encounter (Signed)
yes

## 2022-01-19 ENCOUNTER — Encounter: Payer: Self-pay | Admitting: Orthopedic Surgery

## 2022-01-19 NOTE — Telephone Encounter (Signed)
Patient returned call. Aware note approved and to pick up note. Also discussed scheduling MRI through central scheduling, and to call back when scheduled to set up follow up visit for results.

## 2022-01-23 ENCOUNTER — Ambulatory Visit (HOSPITAL_COMMUNITY)
Admission: RE | Admit: 2022-01-23 | Discharge: 2022-01-23 | Disposition: A | Discharge status: Home / Self Care | Payer: PRIVATE HEALTH INSURANCE | Source: Ambulatory Visit | Attending: Orthopedic Surgery | Admitting: Orthopedic Surgery

## 2022-01-23 DIAGNOSIS — S83242A Other tear of medial meniscus, current injury, left knee, initial encounter: Secondary | ICD-10-CM | POA: Insufficient documentation

## 2022-01-23 DIAGNOSIS — M25562 Pain in left knee: Secondary | ICD-10-CM | POA: Insufficient documentation

## 2022-01-26 ENCOUNTER — Encounter: Payer: Self-pay | Admitting: Orthopedic Surgery

## 2022-01-26 ENCOUNTER — Ambulatory Visit (INDEPENDENT_AMBULATORY_CARE_PROVIDER_SITE_OTHER): Payer: PRIVATE HEALTH INSURANCE | Admitting: Orthopedic Surgery

## 2022-01-26 DIAGNOSIS — M238X2 Other internal derangements of left knee: Secondary | ICD-10-CM | POA: Diagnosis not present

## 2022-01-26 DIAGNOSIS — S83512A Sprain of anterior cruciate ligament of left knee, initial encounter: Secondary | ICD-10-CM

## 2022-01-26 DIAGNOSIS — Z01818 Encounter for other preprocedural examination: Secondary | ICD-10-CM

## 2022-01-26 NOTE — Progress Notes (Signed)
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  I read his MRI he has an ACL tear with a bone contusion on the femur and tibia as expected.  He has no meniscal damage.  He may have some MCL strain  However, his clinical picture is somewhat different.  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

## 2022-01-26 NOTE — Patient Instructions (Signed)
Your surgery will be at [facility] by Dr Harrison  The hospital will contact you with a preoperative appointment to discuss Anesthesia.  Please arrive on time or 15 minutes early for the preoperative appointment, they have a very tight schedule if you are late or do not come in your surgery will be cancelled.  The phone number is [phone]. Please bring your medications with you for the appointment. They will tell you the arrival time and medication instructions when you have your preoperative evaluation. Do not wear nail polish the day of your surgery and if you take Phentermine you need to stop this medication ONE WEEK prior to your surgery. Please arrive at the hospital 2 hours before procedure if scheduled at 9:30 or later in the day or at the time the nurse tells you at your preoperative visit.   If you have my chart do not use the time given in my chart use the time given to you by the nurse during your preoperative visit.   Your surgery  time may change. Please be available for phone calls the day of your surgery and the day before. The Short Stay department may need to discuss changes about your surgery time. Not reaching the you could lead to procedure delays and possible cancellation.  You must have a ride home and someone to stay with you for 24 to 48 hours. The person taking you home will receive and sign for the your discharge instructions.  Please be prepared to give your support person's name and telephone number to Central Registration. Dr Harrison will need that name and phone number post procedure.   

## 2022-01-31 ENCOUNTER — Ambulatory Visit: Payer: PRIVATE HEALTH INSURANCE | Admitting: Orthopedic Surgery

## 2022-02-07 ENCOUNTER — Other Ambulatory Visit: Payer: Self-pay | Admitting: Orthopedic Surgery

## 2022-02-07 DIAGNOSIS — S83512A Sprain of anterior cruciate ligament of left knee, initial encounter: Secondary | ICD-10-CM

## 2022-02-14 NOTE — Patient Instructions (Signed)
Tony Stephens  02/14/2022     @[email or web address] @   Your procedure is scheduled on  02/20/2022.   Report to 02/22/2022 at  0825  A.M.   Call this number if you have problems the morning of surgery:  [phone]   Remember:  Do not eat or drink after midnight.      Take these medicines the morning of surgery with A SIP OF WATER                                         xanax, prilosec.     Do not wear jewelry, make-up or nail polish.  Do not wear lotions, powders, or perfumes, or deodorant.  Do not shave 48 hours prior to surgery.  Men may shave face and neck.  Do not bring valuables to the hospital.  [facility] is not responsible for any belongings or valuables.  Contacts, dentures or bridgework may not be worn into surgery.  Leave your suitcase in the car.  After surgery it may be brought to your room.  For patients admitted to the hospital, discharge time will be determined by your treatment team.  Patients discharged the day of surgery will not be allowed to drive home and must have someone with them for 24 hours.    Special instructions:   DO NOT smoke tobacco or vape for 24 hours before your procedure.  Please read over the following fact sheets that you were given. Pain Booklet, Coughing and Deep Breathing, Surgical Site Infection Prevention, Anesthesia Post-op Instructions, and Care and Recovery After Surgery      Anterior Cruciate Ligament Reconstruction, Care After This sheet gives you information about how to care for yourself after your procedure. Your health care provider may also give you more specific instructions. If you have problems or questions, contact your health care provider. What can I expect after the procedure? After the procedure, it is common to have: Soreness. Swelling. Pain. A small amount of fluid from the incision. Follow these instructions at home: Managing pain, stiffness, and swelling  If directed, put ice on the  affected area. To do this: If you have a removable brace, remove it as told by your health care provider. Put ice in a plastic bag. Place a towel between your skin and the bag. Leave the ice on for 20 minutes, 2-3 times a day. Remove the ice if your skin turns bright red. This is very important. If you cannot feel pain, heat, or cold, you have a greater risk of damage to the area. Move your toes often to reduce stiffness and swelling. Raise (elevate) the affected area above the level of your heart while you are sitting or lying down. Incision care  Follow instructions from your health care provider about how to take care of your incisions. Make sure you: Wash your hands with soap and water for at least 20 seconds before and after you change your bandage (dressing). If soap and water are not available, use hand sanitizer. Change your dressing as told by your health care provider. Leave stitches (sutures), skin glue, or adhesive strips in place. These skin closures may need to stay in place for 2 weeks or longer. If adhesive strip edges start to loosen and curl up, you may trim the loose edges. Do not remove adhesive strips completely unless  your health care provider tells you to do that. Check your incision areas every day for signs of infection. Check for: Redness. More swelling or pain. Blood or more fluid. Warmth. Pus or a bad smell. Bathing Do not take baths, swim, or use a hot tub until your health care provider approves. Ask your health care provider if you may take showers. You may only be allowed to take sponge baths. Keep the dressing dry until your health care provider says it can be removed. If you have a brace: Wear the brace as told by your health care provider. Remove it only as told by your health care provider. Loosen the brace if your toes tingle, become numb, or turn cold and blue. Keep the brace clean. If the brace is not waterproof: Do not let it get wet. Cover it with  a watertight covering when you take a bath or shower. Medicines Take over-the-counter and prescription medicines only as told by your health care provider. Ask your health care provider if the medicine prescribed to you: Requires you to avoid driving or using machinery. Can cause constipation. You may need to take these actions to prevent or treat constipation: Drink enough fluid to keep your urine pale yellow. Take over-the-counter or prescription medicines. Eat foods that are high in fiber, such as beans, whole grains, and fresh fruits and vegetables. Limit foods that are high in fat and processed sugars, such as fried or sweet foods. Activity Return to your normal activities as told by your health care provider. Ask your health care provider what activities are safe for you. If physical therapy was prescribed, do exercises only as directed. Doing exercises may help improve movement and strength in your knee. Driving Do not drive until your health care provider approves. Ask your health care provider when it is safe to drive if you have a brace on your leg. General instructions Do not use the injured leg to support your body weight until your health care provider says that you can. Use crutches as told by your health care provider. Do not use any products that contain nicotine or tobacco, such as cigarettes, e-cigarettes, and chewing tobacco. These can delay incision or bone healing. If you need help quitting, ask your health care provider. Keep all follow-up visits. This is important. Contact a health care provider if: You have a fever or chills. You have redness around an incision. You have more swelling or pain around an incision. You have blood or more fluid coming from an incision. Your incision area feels warm to the touch. You have pus or a bad smell coming from an incision. Any of your incisions break open after sutures or staples have been removed. Get help right away if: You  have severe pain that is not relieved with medicine. You have difficulty breathing or shortness of breath. You have chest pain. You develop pain or swelling in your lower leg or at the back of your knee. These symptoms may represent a serious problem that is an emergency. Do not wait to see if the symptoms will go away. Get medical help right away. Call your local emergency services (911 in the [place] Do not drive yourself to the hospital. Summary After the procedure, it is common to have some soreness, swelling, and pain. Take over-the-counter and prescription medicines only as told by your health care provider. Do not use the injured leg to support your body weight until your health care provider says that you can. Use  crutches as told by your health care provider. Contact a health care provider if you have any signs of infection in the incision area. This information is not intended to replace advice given to you by your health care provider. Make sure you discuss any questions you have with your health care provider. Document Revised: 10/30/2019 Document Reviewed: 10/30/2019 Elsevier Patient Education  2023 Elsevier Inc. General Anesthesia, Adult, Care After The following information offers guidance on how to care for yourself after your procedure. Your health care provider may also give you more specific instructions. If you have problems or questions, contact your health care provider. What can I expect after the procedure? After the procedure, it is common for people to: Have pain or discomfort at the IV site. Have nausea or vomiting. Have a sore throat or hoarseness. Have trouble concentrating. Feel cold or chills. Feel weak, sleepy, or tired (fatigue). Have soreness and body aches. These can affect parts of the body that were not involved in surgery. Follow these instructions at home: For the time period you were told by your health care provider:  Rest. Do not participate in  activities where you could fall or become injured. Do not drive or use machinery. Do not drink alcohol. Do not take sleeping pills or medicines that cause drowsiness. Do not make important decisions or sign legal documents. Do not take care of children on your own. General instructions Drink enough fluid to keep your urine pale yellow. If you have sleep apnea, surgery and certain medicines can increase your risk for breathing problems. Follow instructions from your health care provider about wearing your sleep device: Anytime you are sleeping, including during daytime naps. While taking prescription pain medicines, sleeping medicines, or medicines that make you drowsy. Return to your normal activities as told by your health care provider. Ask your health care provider what activities are safe for you. Take over-the-counter and prescription medicines only as told by your health care provider. Do not use any products that contain nicotine or tobacco. These products include cigarettes, chewing tobacco, and vaping devices, such as e-cigarettes. These can delay incision healing after surgery. If you need help quitting, ask your health care provider. Contact a health care provider if: You have nausea or vomiting that does not get better with medicine. You vomit every time you eat or drink. You have pain that does not get better with medicine. You cannot urinate or have bloody urine. You develop a skin rash. You have a fever. Get help right away if: You have trouble breathing. You have chest pain. You vomit blood. These symptoms may be an emergency. Get help right away. Call 911. Do not wait to see if the symptoms will go away. Do not drive yourself to the hospital. Summary After the procedure, it is common to have a sore throat, hoarseness, nausea, vomiting, or to feel weak, sleepy, or fatigue. For the time period you were told by your health care provider, do not drive or use machinery. Get  help right away if you have difficulty breathing, have chest pain, or vomit blood. These symptoms may be an emergency. This information is not intended to replace advice given to you by your health care provider. Make sure you discuss any questions you have with your health care provider. Document Revised: 09/15/2021 Document Reviewed: 09/15/2021 Elsevier Patient Education  2023 Elsevier Inc. How to Use Chlorhexidine Before Surgery Chlorhexidine gluconate (CHG) is a germ-killing (antiseptic) solution that is used to clean the skin.  It can get rid of the bacteria that normally live on the skin and can keep them away for about 24 hours. To clean your skin with CHG, you may be given: A CHG solution to use in the shower or as part of a sponge bath. A prepackaged cloth that contains CHG. Cleaning your skin with CHG may help lower the risk for infection: While you are staying in the intensive care unit of the hospital. If you have a vascular access, such as a central line, to provide short-term or Capri-term access to your veins. If you have a catheter to drain urine from your bladder. If you are on a ventilator. A ventilator is a machine that helps you breathe by moving air in and out of your lungs. After surgery. What are the risks? Risks of using CHG include: A skin reaction. Hearing loss, if CHG gets in your ears and you have a perforated eardrum. Eye injury, if CHG gets in your eyes and is not rinsed out. The CHG product catching fire. Make sure that you avoid smoking and flames after applying CHG to your skin. Do not use CHG: If you have a chlorhexidine allergy or have previously reacted to chlorhexidine. On babies younger than 59 months of age. How to use CHG solution Use CHG only as told by your health care provider, and follow the instructions on the label. Use the full amount of CHG as directed. Usually, this is one bottle. During a shower Follow these steps when using CHG solution  during a shower (unless your health care provider gives you different instructions): Start the shower. Use your normal soap and shampoo to wash your face and hair. Turn off the shower or move out of the shower stream. Pour the CHG onto a clean washcloth. Do not use any type of brush or rough-edged sponge. Starting at your neck, lather your body down to your toes. Make sure you follow these instructions: If you will be having surgery, pay special attention to the part of your body where you will be having surgery. Scrub this area for at least 1 minute. Do not use CHG on your head or face. If the solution gets into your ears or eyes, rinse them well with water. Avoid your genital area. Avoid any areas of skin that have broken skin, cuts, or scrapes. Scrub your back and under your arms. Make sure to wash skin folds. Let the lather sit on your skin for 1-2 minutes or as [place] as told by your health care provider. Thoroughly rinse your entire body in the shower. Make sure that all body creases and crevices are rinsed well. Dry off with a clean towel. Do not put any substances on your body afterward--such as powder, lotion, or perfume--unless you are told to do so by your health care provider. Only use lotions that are recommended by the manufacturer. Put on clean clothes or pajamas. If it is the night before your surgery, sleep in clean sheets.  During a sponge bath Follow these steps when using CHG solution during a sponge bath (unless your health care provider gives you different instructions): Use your normal soap and shampoo to wash your face and hair. Pour the CHG onto a clean washcloth. Starting at your neck, lather your body down to your toes. Make sure you follow these instructions: If you will be having surgery, pay special attention to the part of your body where you will be having surgery. Scrub this area for at least  1 minute. Do not use CHG on your head or face. If the solution gets  into your ears or eyes, rinse them well with water. Avoid your genital area. Avoid any areas of skin that have broken skin, cuts, or scrapes. Scrub your back and under your arms. Make sure to wash skin folds. Let the lather sit on your skin for 1-2 minutes or as [place]sey as told by your health care provider. Using a different clean, wet washcloth, thoroughly rinse your entire body. Make sure that all body creases and crevices are rinsed well. Dry off with a clean towel. Do not put any substances on your body afterward--such as powder, lotion, or perfume--unless you are told to do so by your health care provider. Only use lotions that are recommended by the manufacturer. Put on clean clothes or pajamas. If it is the night before your surgery, sleep in clean sheets. How to use CHG prepackaged cloths Only use CHG cloths as told by your health care provider, and follow the instructions on the label. Use the CHG cloth on clean, dry skin. Do not use the CHG cloth on your head or face unless your health care provider tells you to. When washing with the CHG cloth: Avoid your genital area. Avoid any areas of skin that have broken skin, cuts, or scrapes. Before surgery Follow these steps when using a CHG cloth to clean before surgery (unless your health care provider gives you different instructions): Using the CHG cloth, vigorously scrub the part of your body where you will be having surgery. Scrub using a back-and-forth motion for 3 minutes. The area on your body should be completely wet with CHG when you are done scrubbing. Do not rinse. Discard the cloth and let the area air-dry. Do not put any substances on the area afterward, such as powder, lotion, or perfume. Put on clean clothes or pajamas. If it is the night before your surgery, sleep in clean sheets.  For general bathing Follow these steps when using CHG cloths for general bathing (unless your health care provider gives you different  instructions). Use a separate CHG cloth for each area of your body. Make sure you wash between any folds of skin and between your fingers and toes. Wash your body in the following order, switching to a new cloth after each step: The front of your neck, shoulders, and chest. Both of your arms, under your arms, and your hands. Your stomach and groin area, avoiding the genitals. Your right leg and foot. Your left leg and foot. The back of your neck, your back, and your buttocks. Do not rinse. Discard the cloth and let the area air-dry. Do not put any substances on your body afterward--such as powder, lotion, or perfume--unless you are told to do so by your health care provider. Only use lotions that are recommended by the manufacturer. Put on clean clothes or pajamas. Contact a health care provider if: Your skin gets irritated after scrubbing. You have questions about using your solution or cloth. You swallow any chlorhexidine. Call your local poison control center ([phone] in the [place] Get help right away if: Your eyes itch badly, or they become very red or swollen. Your skin itches badly and is red or swollen. Your hearing changes. You have trouble seeing. You have swelling or tingling in your mouth or throat. You have trouble breathing. These symptoms may represent a serious problem that is an emergency. Do not wait to see if the symptoms will go  away. Get medical help right away. Call your local emergency services (911 in the [place] Do not drive yourself to the hospital. Summary Chlorhexidine gluconate (CHG) is a germ-killing (antiseptic) solution that is used to clean the skin. Cleaning your skin with CHG may help to lower your risk for infection. You may be given CHG to use for bathing. It may be in a bottle or in a prepackaged cloth to use on your skin. Carefully follow your health care provider's instructions and the instructions on the product label. Do not use CHG if you have  a chlorhexidine allergy. Contact your health care provider if your skin gets irritated after scrubbing. This information is not intended to replace advice given to you by your health care provider. Make sure you discuss any questions you have with your health care provider. Document Revised: 10/16/2021 Document Reviewed: 08/29/2020 Elsevier Patient Education  2023 [employer]

## 2022-02-16 ENCOUNTER — Encounter (HOSPITAL_COMMUNITY)
Admission: RE | Admit: 2022-02-16 | Discharge: 2022-02-16 | Disposition: A | Discharge status: Home / Self Care | Payer: PRIVATE HEALTH INSURANCE | Source: Ambulatory Visit | Attending: Orthopedic Surgery | Admitting: Orthopedic Surgery

## 2022-02-16 ENCOUNTER — Encounter (HOSPITAL_COMMUNITY): Payer: Self-pay

## 2022-02-16 DIAGNOSIS — Z01812 Encounter for preprocedural laboratory examination: Secondary | ICD-10-CM | POA: Insufficient documentation

## 2022-02-16 DIAGNOSIS — M238X2 Other internal derangements of left knee: Secondary | ICD-10-CM | POA: Insufficient documentation

## 2022-02-16 DIAGNOSIS — S83512A Sprain of anterior cruciate ligament of left knee, initial encounter: Secondary | ICD-10-CM | POA: Insufficient documentation

## 2022-02-16 DIAGNOSIS — Z01818 Encounter for other preprocedural examination: Secondary | ICD-10-CM

## 2022-02-16 HISTORY — DX: Other specified postprocedural states: Z98.890

## 2022-02-16 HISTORY — DX: Other complications of anesthesia, initial encounter: T88.59XA

## 2022-02-16 HISTORY — DX: Nausea with vomiting, unspecified: R11.2

## 2022-02-16 LAB — BASIC METABOLIC PANEL
Anion gap: 6 (ref 5–15)
BUN: 18 mg/dL (ref 6–20)
CO2: 26 mmol/L (ref 22–32)
Calcium: 8.9 mg/dL (ref 8.9–10.3)
Chloride: 108 mmol/L (ref 98–111)
Creatinine, Ser: 1.11 mg/dL (ref 0.61–1.24)
GFR, Estimated: >60 mL/min (ref >60)
Glucose, Bld: 80 mg/dL (ref 70–99)
Potassium: 3.9 mmol/L (ref 3.5–5.1)
Sodium: 140 mmol/L (ref 135–145)

## 2022-02-16 LAB — CBC WITH DIFFERENTIAL/PLATELET
Abs Immature Granulocytes: 0.01 10*3/uL (ref 0.00–0.07)
Basophils Absolute: 0 10*3/uL (ref 0.0–0.1)
Basophils Relative: 0 %
Eosinophils Absolute: 0.1 10*3/uL (ref 0.0–0.5)
Eosinophils Relative: 2 %
HCT: 44.1 % (ref 39.0–52.0)
Hemoglobin: 16 g/dL (ref 13.0–17.0)
Immature Granulocytes: 0 %
Lymphocytes Relative: 31 %
Lymphs Abs: 1.6 10*3/uL (ref 0.7–4.0)
MCH: 30.8 pg (ref 26.0–34.0)
MCHC: 36.3 g/dL — ABNORMAL HIGH (ref 30.0–36.0)
MCV: 84.8 fL (ref 80.0–100.0)
Monocytes Absolute: 0.4 10*3/uL (ref 0.1–1.0)
Monocytes Relative: 7 %
Neutro Abs: 3.1 10*3/uL (ref 1.7–7.7)
Neutrophils Relative %: 60 %
Platelets: 239 10*3/uL (ref 150–400)
RBC: 5.2 MIL/uL (ref 4.22–5.81)
RDW: 12.2 % (ref 11.5–15.5)
WBC: 5.2 10*3/uL (ref 4.0–10.5)
nRBC: 0 % (ref 0.0–0.2)

## 2022-02-19 ENCOUNTER — Encounter: Payer: Self-pay | Admitting: Orthopedic Surgery

## 2022-02-20 ENCOUNTER — Ambulatory Visit (HOSPITAL_COMMUNITY): Payer: PRIVATE HEALTH INSURANCE | Admitting: Anesthesiology

## 2022-02-20 ENCOUNTER — Ambulatory Visit (HOSPITAL_BASED_OUTPATIENT_CLINIC_OR_DEPARTMENT_OTHER): Payer: PRIVATE HEALTH INSURANCE | Admitting: Anesthesiology

## 2022-02-20 ENCOUNTER — Encounter (HOSPITAL_COMMUNITY)
Admission: RE | Disposition: A | Discharge status: Home / Self Care | Payer: Self-pay | Source: Ambulatory Visit | Attending: Orthopedic Surgery

## 2022-02-20 ENCOUNTER — Ambulatory Visit (HOSPITAL_COMMUNITY)
Admission: RE | Admit: 2022-02-20 | Discharge: 2022-02-20 | Disposition: A | Discharge status: Home / Self Care | Payer: PRIVATE HEALTH INSURANCE | Source: Ambulatory Visit | Attending: Orthopedic Surgery | Admitting: Orthopedic Surgery

## 2022-02-20 ENCOUNTER — Other Ambulatory Visit: Payer: Self-pay

## 2022-02-20 ENCOUNTER — Encounter (HOSPITAL_COMMUNITY): Payer: Self-pay | Admitting: Orthopedic Surgery

## 2022-02-20 DIAGNOSIS — Y9367 Activity, basketball: Secondary | ICD-10-CM | POA: Diagnosis not present

## 2022-02-20 DIAGNOSIS — S83512D Sprain of anterior cruciate ligament of left knee, subsequent encounter: Secondary | ICD-10-CM

## 2022-02-20 DIAGNOSIS — K219 Gastro-esophageal reflux disease without esophagitis: Secondary | ICD-10-CM | POA: Insufficient documentation

## 2022-02-20 DIAGNOSIS — S83282D Other tear of lateral meniscus, current injury, left knee, subsequent encounter: Secondary | ICD-10-CM | POA: Diagnosis not present

## 2022-02-20 DIAGNOSIS — S83282A Other tear of lateral meniscus, current injury, left knee, initial encounter: Secondary | ICD-10-CM

## 2022-02-20 DIAGNOSIS — X58XXXA Exposure to other specified factors, initial encounter: Secondary | ICD-10-CM | POA: Insufficient documentation

## 2022-02-20 DIAGNOSIS — S83512A Sprain of anterior cruciate ligament of left knee, initial encounter: Secondary | ICD-10-CM | POA: Diagnosis not present

## 2022-02-20 HISTORY — PX: KNEE ARTHROSCOPY: SHX127

## 2022-02-20 SURGERY — ARTHROSCOPY, KNEE
Anesthesia: General | Site: Knee | Laterality: Left

## 2022-02-20 MED ORDER — EPINEPHRINE PF 1 MG/ML IJ SOLN
INTRAMUSCULAR | Status: AC
  Filled 2022-02-20: qty 8

## 2022-02-20 MED ORDER — DIPHENHYDRAMINE HCL 50 MG/ML IJ SOLN
INTRAMUSCULAR | Status: DC | PRN
  Administered 2022-02-20: 12.5 mg via INTRAVENOUS

## 2022-02-20 MED ORDER — LIDOCAINE HCL (CARDIAC) PF 100 MG/5ML IV SOSY
PREFILLED_SYRINGE | INTRAVENOUS | Status: DC | PRN
  Administered 2022-02-20: 50 mg via INTRAVENOUS

## 2022-02-20 MED ORDER — DEXAMETHASONE SODIUM PHOSPHATE 10 MG/ML IJ SOLN
INTRAMUSCULAR | Status: DC | PRN
  Administered 2022-02-20: 10 mg via INTRAVENOUS

## 2022-02-20 MED ORDER — PROPOFOL 10 MG/ML IV BOLUS
INTRAVENOUS | Status: DC | PRN
  Administered 2022-02-20: 200 mg via INTRAVENOUS

## 2022-02-20 MED ORDER — PROPOFOL 10 MG/ML IV BOLUS
INTRAVENOUS | Status: AC
  Filled 2022-02-20: qty 20

## 2022-02-20 MED ORDER — FENTANYL CITRATE (PF) 100 MCG/2ML IJ SOLN
INTRAMUSCULAR | Status: DC | PRN
  Administered 2022-02-20: 50 ug via INTRAVENOUS
  Administered 2022-02-20: 100 ug via INTRAVENOUS
  Administered 2022-02-20 (×3): 25 ug via INTRAVENOUS

## 2022-02-20 MED ORDER — IBUPROFEN 800 MG PO TABS
800.0000 mg | ORAL_TABLET | Freq: Once | ORAL | Status: AC
  Administered 2022-02-20: 800 mg via ORAL
  Filled 2022-02-20: qty 1

## 2022-02-20 MED ORDER — DIPHENHYDRAMINE HCL 50 MG/ML IJ SOLN
INTRAMUSCULAR | Status: AC
  Filled 2022-02-20: qty 1

## 2022-02-20 MED ORDER — FENTANYL CITRATE (PF) 250 MCG/5ML IJ SOLN
INTRAMUSCULAR | Status: AC
  Filled 2022-02-20: qty 5

## 2022-02-20 MED ORDER — EPHEDRINE 5 MG/ML INJ
INTRAVENOUS | Status: AC
  Filled 2022-02-20: qty 10

## 2022-02-20 MED ORDER — OXYCODONE HCL 5 MG/5ML PO SOLN: 5.0000 mg | Freq: Once | ORAL | Status: DC | PRN

## 2022-02-20 MED ORDER — SODIUM CHLORIDE 0.9 % IR SOLN
Status: DC | PRN
  Administered 2022-02-20: 1000 mL

## 2022-02-20 MED ORDER — FENTANYL CITRATE PF 50 MCG/ML IJ SOSY: 25.0000 ug | PREFILLED_SYRINGE | INTRAMUSCULAR | Status: DC | PRN

## 2022-02-20 MED ORDER — MIDAZOLAM HCL 2 MG/2ML IJ SOLN
INTRAMUSCULAR | Status: AC
  Filled 2022-02-20: qty 2

## 2022-02-20 MED ORDER — OXYCODONE HCL 5 MG PO TABS: 5.0000 mg | ORAL_TABLET | Freq: Once | ORAL | Status: DC | PRN

## 2022-02-20 MED ORDER — SODIUM CHLORIDE 0.9 % IR SOLN
Status: DC | PRN
  Administered 2022-02-20 (×3): 3000 mL

## 2022-02-20 MED ORDER — CHLORHEXIDINE GLUCONATE 0.12 % MT SOLN
OROMUCOSAL | Status: AC
  Filled 2022-02-20: qty 15

## 2022-02-20 MED ORDER — HYDROCODONE-ACETAMINOPHEN 5-325 MG PO TABS
1.0000 | ORAL_TABLET | ORAL | 0 refills | Status: DC | PRN
Start: 2022-02-20 — End: 2022-07-17

## 2022-02-20 MED ORDER — ONDANSETRON HCL 4 MG/2ML IJ SOLN
4.0000 mg | Freq: Once | INTRAMUSCULAR | Status: AC
  Administered 2022-02-20: 4 mg via INTRAVENOUS
  Filled 2022-02-20: qty 2

## 2022-02-20 MED ORDER — CEFAZOLIN SODIUM-DEXTROSE 2-4 GM/100ML-% IV SOLN
2.0000 g | INTRAVENOUS | Status: AC
  Administered 2022-02-20: 2 g via INTRAVENOUS
  Filled 2022-02-20: qty 100

## 2022-02-20 MED ORDER — ONDANSETRON HCL 4 MG/2ML IJ SOLN: 4.0000 mg | Freq: Once | INTRAMUSCULAR | Status: DC | PRN

## 2022-02-20 MED ORDER — BUPIVACAINE-EPINEPHRINE 0.5% -1:200000 IJ SOLN
INTRAMUSCULAR | Status: DC | PRN
  Administered 2022-02-20: 40 mL

## 2022-02-20 MED ORDER — ONDANSETRON HCL 4 MG/2ML IJ SOLN
INTRAMUSCULAR | Status: DC | PRN
  Administered 2022-02-20: 4 mg via INTRAVENOUS

## 2022-02-20 MED ORDER — HYDROCODONE-ACETAMINOPHEN 5-325 MG PO TABS
1.0000 | ORAL_TABLET | Freq: Once | ORAL | Status: AC
  Administered 2022-02-20: 1 via ORAL
  Filled 2022-02-20: qty 1

## 2022-02-20 MED ORDER — BUPIVACAINE-EPINEPHRINE (PF) 0.5% -1:200000 IJ SOLN
INTRAMUSCULAR | Status: AC
  Filled 2022-02-20: qty 60

## 2022-02-20 MED ORDER — MIDAZOLAM HCL 5 MG/5ML IJ SOLN
INTRAMUSCULAR | Status: DC | PRN
  Administered 2022-02-20: 2 mg via INTRAVENOUS

## 2022-02-20 MED ORDER — LACTATED RINGERS IV SOLN: INTRAVENOUS | Status: DC | PRN

## 2022-02-20 MED ORDER — LIDOCAINE HCL (PF) 2 % IJ SOLN
INTRAMUSCULAR | Status: AC
  Filled 2022-02-20: qty 10

## 2022-02-20 SURGICAL SUPPLY — 53 items
APL PRP STRL LF DISP 70% ISPRP (MISCELLANEOUS) ×1
BLADE SHAVER TORPEDO 4X13 (MISCELLANEOUS) IMPLANT
BLADE SURG SZ11 CARB STEEL (BLADE) ×2 IMPLANT
BNDG CMPR STD VLCR NS LF 5.8X6 (GAUZE/BANDAGES/DRESSINGS) ×1
BNDG ELASTIC 6X5.8 VLCR NS LF (GAUZE/BANDAGES/DRESSINGS) ×2 IMPLANT
CHLORAPREP W/TINT 26 (MISCELLANEOUS) ×4 IMPLANT
CLOTH BEACON ORANGE TIMEOUT ST (SAFETY) ×2 IMPLANT
COOLER ICEMAN CLASSIC (MISCELLANEOUS) IMPLANT
COVER LIGHT HANDLE STERIS (MISCELLANEOUS) ×4 IMPLANT
CUFF TOURN SGL QUICK 34 (TOURNIQUET CUFF) ×1
CUFF TRNQT CYL 34X4.125X (TOURNIQUET CUFF) ×2 IMPLANT
DECANTER SPIKE VIAL GLASS SM (MISCELLANEOUS) ×4 IMPLANT
FLOOR PAD 36X40 (MISCELLANEOUS) ×1
GAUZE SPONGE 4X4 12PLY STRL (GAUZE/BANDAGES/DRESSINGS) ×2 IMPLANT
GAUZE XEROFORM 5X9 LF (GAUZE/BANDAGES/DRESSINGS) ×2 IMPLANT
GLOVE BIO SURGEON STRL SZ7 (GLOVE) IMPLANT
GLOVE BIOGEL PI IND STRL 7.0 (GLOVE) ×4 IMPLANT
GLOVE BIOGEL PI IND STRL 8 (GLOVE) IMPLANT
GLOVE BIOGEL PI INDICATOR 7.0 (GLOVE) ×2
GLOVE BIOGEL PI INDICATOR 8 (GLOVE) ×1
GLOVE SKINSENSE NS SZ8.0 LF (GLOVE) ×1
GLOVE SKINSENSE STRL SZ8.0 LF (GLOVE) IMPLANT
GOWN STRL REUS W/TWL LRG LVL3 (GOWN DISPOSABLE) ×4 IMPLANT
GOWN STRL REUS W/TWL XL LVL3 (GOWN DISPOSABLE) ×2 IMPLANT
INST SET MINOR BONE (KITS) ×2 IMPLANT
IV NS IRRIG 3000ML ARTHROMATIC (IV SOLUTION) ×16 IMPLANT
KIT BLADEGUARD II DBL (SET/KITS/TRAYS/PACK) ×2 IMPLANT
KIT TURNOVER CYSTO (KITS) ×2 IMPLANT
MANIFOLD NEPTUNE II (INSTRUMENTS) ×2 IMPLANT
MARKER SKIN DUAL TIP RULER LAB (MISCELLANEOUS) ×2 IMPLANT
NDL HYPO 21X1.5 SAFETY (NEEDLE) ×2 IMPLANT
NEEDLE HYPO 21X1.5 SAFETY (NEEDLE) ×1 IMPLANT
NS IRRIG 1000ML POUR BTL (IV SOLUTION) ×2 IMPLANT
PACK ARTHRO LIMB DRAPE STRL (MISCELLANEOUS) ×2 IMPLANT
PACK BASIC III (CUSTOM PROCEDURE TRAY) ×1
PACK SRG BSC III STRL LF ECLPS (CUSTOM PROCEDURE TRAY) ×2 IMPLANT
PAD ABD 5X9 TENDERSORB (GAUZE/BANDAGES/DRESSINGS) ×2 IMPLANT
PAD ARMBOARD 7.5X6 YLW CONV (MISCELLANEOUS) ×2 IMPLANT
PAD COLD SHLDR SM WRAP-ON (PAD) IMPLANT
PAD FLOOR 36X40 (MISCELLANEOUS) ×2 IMPLANT
PADDING CAST COTTON 6X4 STRL (CAST SUPPLIES) IMPLANT
PENCIL SMOKE EVACUATOR COATED (MISCELLANEOUS) ×2 IMPLANT
PORT APPOLLO RF 90DEGREE MULTI (SURGICAL WAND) IMPLANT
SET ARTHROSCOPY INST (INSTRUMENTS) ×2 IMPLANT
SET BASIN LINEN APH (SET/KITS/TRAYS/PACK) ×2 IMPLANT
SUT ETHILON 3 0 FSL (SUTURE) IMPLANT
SUT MON AB 0 CT1 (SUTURE) ×2 IMPLANT
SUT MON AB 2-0 CT1 36 (SUTURE) ×2 IMPLANT
SUT VIC AB 1 CT1 27 (SUTURE) ×1
SUT VIC AB 1 CT1 27XBRD ANTBC (SUTURE) ×2 IMPLANT
SYR 30ML LL (SYRINGE) ×2 IMPLANT
TUBING ARTHROSCOPY INFLOW/OUT (IRRIGATION / IRRIGATOR) ×2 IMPLANT
YANKAUER SUCT 12FT TUBE ARGYLE (SUCTIONS) ×2 IMPLANT

## 2022-02-20 NOTE — Transfer of Care (Signed)
Immediate Anesthesia Transfer of Care Note  Patient: Tony Stephens  Procedure(s) Performed: ARTHROSCOPY KNEE (Left: Knee) EXAM UNDER ANESTHESIA, LIMITED DEBRIDEMENT (Left: Knee)  Patient Location: PACU  Anesthesia Type:General  Level of Consciousness: awake  Airway & Oxygen Therapy: Patient Spontanous Breathing and Patient connected to nasal cannula oxygen  Post-op Assessment: Report given to RN and Post -op Vital signs reviewed and stable  Post vital signs: Reviewed and stable  Last Vitals:  Vitals Value Taken Time  BP    Temp    Pulse 82 02/20/22 1214  Resp 10 02/20/22 1214  SpO2 100 % 02/20/22 1214  Vitals shown include unvalidated device data.  Last Pain:  Vitals:   02/20/22 0932  TempSrc: Oral  PainSc: 0-No pain      Patients Stated Pain Goal: 6 (02/20/22 0932)  Complications: No notable events documented.

## 2022-02-20 NOTE — Brief Op Note (Signed)
02/20/2022  12:11 PM  PATIENT:  Tony Stephens  36 y.o. male  PRE-OPERATIVE DIAGNOSIS:  Torn Anterior Cruciate Ligament left knee  POST-OPERATIVE DIAGNOSIS:  Partial Torn Anterior Cruciate Ligament left knee, Lateral Meniscus Tear  PROCEDURE:  Procedure(s): EXAM UNDER ANESTHESIA, left knee ARTHROSCOPY KNEE (Left)  LIMITED DEBRIDEMENT (Left) KNEE  Findings.  Examination under anesthesia revealed full range of motion of the left knee no effusion.  Collateral ligaments are stable at 0 and 30 degrees.  PCL was intact with stable posterior drawer  Anterior drawer test mild side-to-side difference less than 5 mm.  Pivot shift test glide only.  Lachman test slight side-to-side difference with firm endpoint.  Grade trace positive  Intra-Op findings partial ACL tear it appeared to be the anterior medial bundle.  Posteromedial bundle intact remaining ACL tissue firm on probing  Small posterior horn lateral meniscus tear  Medial compartment and patellofemoral joint normal  Details of procedure  The patient was seen in the preop area and the left knee was confirmed as the surgical site.  Chart review was completed.  Surgeons initials placed over the left knee  The patient was taken to the operating room for LMA general anesthesia  This was followed by examination under anesthesia.  The right and left knee were examined and and compared.  Findings are listed above  We then proceeded with timeout after sterile prep and drape.  A standard lateral portal was established the scope was placed into the joint and diagnostic arthroscopy was performed.  A medial portal was placed and the probe was placed into the joint.  The anterior medial bundle of the ACL was torn the posterolateral bundle was intact.  After probing and removal of the ligamentum mucosum ACL tissue was more well defined and easier to evaluate.  Firm endpoint was noted on probing the posterior lateral bundle was visible and firm  connecting to the femur.  There was a small posterior horn undersurface lateral meniscus tear which was debrided with a shaver from the medial portal.  The medial meniscus was intact on probing  Chondral surfaces were normal.  PCL normal as well.  Patellofemoral joint normal.  Cartilaginous surfaces of the lateral femoral condyle and tibial plateau were also normal  The joint was irrigated suctioned dry and portals were closed with 3-0 nylon suture.  We injected Marcaine through the scope prior to removal  Sterile dressing was applied along with a Cryo/Cuff  Patient was extubated and taken to recovery in stable condition  Postop plan the patient can be full weightbearing with crutches  He can start early range of motion exercises and will then be referred for outpatient physical therapy for 4 weeks with a playmaker brace.  After 4 weeks we can discuss return to work full duty status based on condition of the knee and we can determine whether or not he should be braced which most likely he will need some type of bracing at that point.    SURGEON:  Surgeon(s) and Role:    * Inri Sobieski E, MD - Primary  PHYSICIAN ASSISTANT:   ASSISTANTS: none   ANESTHESIA:   general  EBL:  2 mL   BLOOD ADMINISTERED:none  DRAINS: none   LOCAL MEDICATIONS USED:  MARCAINE     SPECIMEN:  No Specimen  DISPOSITION OF SPECIMEN:  N/A  COUNTS:  YES  TOURNIQUET:  * Missing tourniquet times found for documented tourniquets in log: [id number] *  DICTATION: .Dragon Dictation  PLAN OF CARE:   Discharge to home after PACU  PATIENT DISPOSITION:  PACU - hemodynamically stable.   Delay start of Pharmacological VTE agent (>24hrs) due to surgical blood loss or risk of bleeding: not applicable

## 2022-02-20 NOTE — Op Note (Signed)
02/20/2022  12:11 PM  PATIENT:  Tony Stephens  36 y.o. male  PRE-OPERATIVE DIAGNOSIS:  Torn Anterior Cruciate Ligament left knee  POST-OPERATIVE DIAGNOSIS:  Partial Torn Anterior Cruciate Ligament left knee, Lateral Meniscus Tear  PROCEDURE:  Procedure(s): EXAM UNDER ANESTHESIA, left knee ARTHROSCOPY KNEE (Left)  LIMITED DEBRIDEMENT (Left) KNEE  Findings.  Examination under anesthesia revealed full range of motion of the left knee no effusion.  Collateral ligaments are stable at 0 and 30 degrees.  PCL was intact with stable posterior drawer  Anterior drawer test mild side-to-side difference less than 5 mm.  Pivot shift test glide only.  Lachman test slight side-to-side difference with firm endpoint.  Grade trace positive  Intra-Op findings partial ACL tear it appeared to be the anterior medial bundle.  Posteromedial bundle intact remaining ACL tissue firm on probing  Small posterior horn lateral meniscus tear  Medial compartment and patellofemoral joint normal  Details of procedure  The patient was seen in the preop area and the left knee was confirmed as the surgical site.  Chart review was completed.  Surgeons initials placed over the left knee  The patient was taken to the operating room for LMA general anesthesia  This was followed by examination under anesthesia.  The right and left knee were examined and and compared.  Findings are listed above  We then proceeded with timeout after sterile prep and drape.  A standard lateral portal was established the scope was placed into the joint and diagnostic arthroscopy was performed.  A medial portal was placed and the probe was placed into the joint.  The anterior medial bundle of the ACL was torn the posterolateral bundle was intact.  After probing and removal of the ligamentum mucosum ACL tissue was more well defined and easier to evaluate.  Firm endpoint was noted on probing the posterior lateral bundle was visible and firm  connecting to the femur.  There was a small posterior horn undersurface lateral meniscus tear which was debrided with a shaver from the medial portal.  The medial meniscus was intact on probing  Chondral surfaces were normal.  PCL normal as well.  Patellofemoral joint normal.  Cartilaginous surfaces of the lateral femoral condyle and tibial plateau were also normal  The joint was irrigated suctioned dry and portals were closed with 3-0 nylon suture.  We injected Marcaine through the scope prior to removal  Sterile dressing was applied along with a Cryo/Cuff  Patient was extubated and taken to recovery in stable condition  Postop plan the patient can be full weightbearing with crutches  He can start early range of motion exercises and will then be referred for outpatient physical therapy for 4 weeks with a playmaker brace.  After 4 weeks we can discuss return to work full duty status based on condition of the knee and we can determine whether or not he should be braced which most likely he will need some type of bracing at that point.    SURGEON:  Surgeon(s) and Role:    * Vickki Hearing, MD - Primary  PHYSICIAN ASSISTANT:   ASSISTANTS: none   ANESTHESIA:   general  EBL:  2 mL   BLOOD ADMINISTERED:none  DRAINS: none   LOCAL MEDICATIONS USED:  MARCAINE     SPECIMEN:  No Specimen  DISPOSITION OF SPECIMEN:  N/A  COUNTS:  YES  TOURNIQUET:  * Missing tourniquet times found for documented tourniquets in log: [id number] *  DICTATION: .[name] Dictation  PLAN OF CARE:  Discharge to home after PACU  PATIENT DISPOSITION:  PACU - hemodynamically stable.   Delay start of Pharmacological VTE agent (>24hrs) due to surgical blood loss or risk of bleeding: not applicable

## 2022-02-20 NOTE — Anesthesia Preprocedure Evaluation (Signed)
Anesthesia Evaluation  Patient identified by MRN, date of birth, ID band Patient awake    Reviewed: Allergy & Precautions, H&P , NPO status , Patient's Chart, lab work & pertinent test results, reviewed documented beta blocker date and time   History of Anesthesia Complications (+) PONV and history of anesthetic complications  Airway Mallampati: II  TM Distance: >3 FB Neck ROM: full    Dental no notable dental hx.    Pulmonary neg pulmonary ROS,    Pulmonary exam normal breath sounds clear to auscultation       Cardiovascular Exercise Tolerance: Good negative cardio ROS   Rhythm:regular Rate:Normal     Neuro/Psych negative neurological ROS  negative psych ROS   GI/Hepatic Neg liver ROS, GERD  Medicated,  Endo/Other  negative endocrine ROS  Renal/GU negative Renal ROS  negative genitourinary   Musculoskeletal   Abdominal   Peds  Hematology negative hematology ROS (+)   Anesthesia Other Findings   Reproductive/Obstetrics negative OB ROS                             Anesthesia Physical Anesthesia Plan  ASA: 2  Anesthesia Plan: General and General LMA   Post-op Pain Management:    Induction:   PONV Risk Score and Plan: Ondansetron  Airway Management Planned:   Additional Equipment:   Intra-op Plan:   Post-operative Plan:   Informed Consent: I have reviewed the patients History and Physical, chart, labs and discussed the procedure including the risks, benefits and alternatives for the proposed anesthesia with the patient or authorized representative who has indicated his/her understanding and acceptance.     Dental Advisory Given  Plan Discussed with: CRNA  Anesthesia Plan Comments:         Anesthesia Quick Evaluation

## 2022-02-20 NOTE — Anesthesia Procedure Notes (Signed)
Procedure Name: LMA Insertion Date/Time: 02/20/2022 10:52 AM  Performed by: Caren Macadam, CRNAPre-anesthesia Checklist: Patient identified, Emergency Drugs available, Suction available and Patient being monitored Patient Re-evaluated:Patient Re-evaluated prior to induction Oxygen Delivery Method: Circle system utilized Preoxygenation: Pre-oxygenation with 100% oxygen Induction Type: IV induction Ventilation: Mask ventilation without difficulty LMA: LMA inserted LMA Size: 4.0 Number of attempts: 1 Placement Confirmation: positive ETCO2 and breath sounds checked- equal and bilateral Tube secured with: Tape Dental Injury: Teeth and Oropharynx as per pre-operative assessment

## 2022-02-20 NOTE — Brief Op Note (Signed)
02/20/2022  12:05 PM  PATIENT:  Tony Stephens  35 y.o. male  PRE-OPERATIVE DIAGNOSIS:  Torn Anterior Cruciate Ligament left knee  POST-OPERATIVE DIAGNOSIS:  Partial Torn Anterior Cruciate Ligament left knee, Lateral Meniscus Tear  PROCEDURE:  Procedure(s): EXAM UNDER ANESTHESIA, left knee ARTHROSCOPY KNEE (Left)  LIMITED DEBRIDEMENT (Left) KNEE  Findings.  Examination under anesthesia revealed full range of motion of the left knee no effusion.  Collateral ligaments are stable at 0 and 30 degrees.  PCL was intact with stable posterior drawer  Anterior drawer test mild side-to-side difference less than 5 mm.  Pivot shift test glide only.  Lachman test slight side-to-side difference with firm endpoint.  Grade trace positive  Intra-Op findings partial ACL tear it appeared to be the anterior medial bundle.  Posteromedial bundle intact remaining ACL tissue firm on probing  Small posterior horn lateral meniscus tear  Medial compartment and patellofemoral joint normal  Details of procedure  The patient was seen in the preop area and the left knee was confirmed as the surgical site.  Chart review was completed.  Surgeons initials placed over the left knee  The patient was taken to the operating room for LMA general anesthesia  This was followed by examination under anesthesia.  The right and left knee were examined and and compared.  Findings are listed above  We then proceeded with timeout after sterile prep and drape.  A standard lateral portal was established the scope was placed into the joint and diagnostic arthroscopy was performed.  A medial portal was placed and the probe was placed into the joint.  The anterior medial bundle of the ACL was torn the posterolateral bundle was intact.  After probing and removal of the ligamentum mucosum ACL tissue was more well defined and easier to evaluate.  Firm endpoint was noted on probing the posterior lateral bundle was visible and firm  connecting to the femur.  There was a small posterior horn undersurface lateral meniscus tear which was debrided with a shaver from the medial portal.  The medial meniscus was intact on probing  Chondral surfaces were normal.  PCL normal as well.  Patellofemoral joint normal.  Cartilaginous surfaces of the lateral femoral condyle and tibial plateau were also normal  The joint was irrigated suctioned dry and portals were closed with 3-0 nylon suture.  We injected Marcaine through the scope prior to removal  Sterile dressing was applied along with a Cryo/Cuff  Patient was extubated and taken to recovery in stable condition  Postop plan the patient can be full weightbearing with crutches  He can start early range of motion exercises and will then be referred for outpatient physical therapy for 4 weeks with a playmaker brace.  After 4 weeks we can discuss return to work full duty status based on condition of the knee and we can determine whether or not he should be braced which most likely he will need some type of bracing at that point.    SURGEON:  Surgeon(s) and Role:    * Vickki Hearing, MD - Primary  PHYSICIAN ASSISTANT:   ASSISTANTS: none   ANESTHESIA:   general  EBL:  2 mL   BLOOD ADMINISTERED:none  DRAINS: none   LOCAL MEDICATIONS USED:  MARCAINE     SPECIMEN:  No Specimen  DISPOSITION OF SPECIMEN:  N/A  COUNTS:  YES  TOURNIQUET:  * Missing tourniquet times found for documented tourniquets in log: [id number] *  DICTATION: .[name] Dictation  PLAN OF CARE:  Discharge to home after PACU  PATIENT DISPOSITION:  PACU - hemodynamically stable.   Delay start of Pharmacological VTE agent (>24hrs) due to surgical blood loss or risk of bleeding: not applicable

## 2022-02-20 NOTE — H&P (Signed)
  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  He injured his knee on June 27 playing basketball.  He planted to jump and his knee gave way his pain was acute he went to the emergency room x-rays were negative he did have an effusion he was placed on ibuprofen and placed in a hinged knee brace   read his MRI he has an ACL tear with a bone contusion on the femur and tibia as expected.  He has no meniscal damage.  He may have some MCL strain  However, his clinical picture is somewhat different.  He says after about a week or so he started doing some rehabilitation exercises and now he can jog forward he did some lateral movements without any issue he is not having any pain he is regained his motion his swelling is down  Review of systems negative  Vital signs are stable  General appearance normal normal weight normal BMI  Cardiovascular exam no swelling or varicosities palpation of pulses was normal with normal temperature no edema or tenderness  Lymph nodes were negative  Skin was clean  Neurologically coordination was intact good deep tendon reflexes normal sensation  He was awake alert and oriented x3 without depression anxiety or agitation  Gait was normal  Left knee: I reexamined his knee again he has a slightly positive Lachman and a pivot glide  He probably has a partial proximal ACL tear   Assessment and plan The best thing at this point is to do an arthroscopic examination exam under anesthesia repair of the ACL with reconstruction with patellar tendon if he has a pivot shift test that is positive  Otherwise if not he can have rehabilitation  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  If findings  are positive we will proceed with ACL reconstruction with patellar tendon autograft  If his exam is not consistent with instability we will only do an arthroscopic procedure with no reconstruction    Past Medical History:  Diagnosis Date   Complication of anesthesia    GERD (gastroesophageal reflux disease)    Headache    PONV (postoperative nausea and vomiting)     Past Surgical History:  Procedure Laterality Date   arm surgery Left    TESTICLE TORSION REDUCTION     Family History  Problem Relation Age of Onset   Diabetes Father    Hypertension Father     Social History   Tobacco Use   Smoking status: Never   Smokeless tobacco: Never  Substance Use Topics   Alcohol use: No   Drug use: No

## 2022-02-20 NOTE — Interval H&P Note (Signed)
History and Physical Interval Note:  02/20/2022 10:24 AM  Tony Stephens  has presented today for surgery, with the diagnosis of Torn Anterior Cruciate Ligament left knee.  The various methods of treatment have been discussed with the patient and family. After consideration of risks, benefits and other options for treatment, the patient has consented to  Procedure(s): ANTERIOR CRUCIATE LIGAMENT (ACL) REPAIR (Left) preceeded by EUA as a surgical intervention.  The patient's history has been reviewed, patient examined, no change in status, stable for surgery.  I have reviewed the patient's chart and labs.  Questions were answered to the patient's satisfaction.     Fuller Canada

## 2022-02-21 NOTE — Anesthesia Postprocedure Evaluation (Signed)
Anesthesia Post Note  Patient: Tony Stephens  Procedure(s) Performed: ARTHROSCOPY KNEE (Left: Knee) EXAM UNDER ANESTHESIA, LIMITED DEBRIDEMENT (Left: Knee)  Patient location during evaluation: Phase II Anesthesia Type: General Level of consciousness: awake Pain management: pain level controlled Vital Signs Assessment: post-procedure vital signs reviewed and stable Respiratory status: spontaneous breathing and respiratory function stable Cardiovascular status: blood pressure returned to baseline and stable Postop Assessment: no headache and no apparent nausea or vomiting Anesthetic complications: no Comments: Late entry   No notable events documented.   Last Vitals:  Vitals:   02/20/22 1245 02/20/22 1300  BP: 135/85 (!) 142/79  Pulse: 83 61  Resp: 17 15  Temp: 36.6 C 36.6 C  SpO2: 99% 99%    Last Pain:  Vitals:   02/21/22 1253  TempSrc:   PainSc: 2                  Windell Norfolk

## 2022-02-26 ENCOUNTER — Encounter (HOSPITAL_COMMUNITY): Payer: Self-pay | Admitting: Orthopedic Surgery

## 2022-02-27 DIAGNOSIS — Z9889 Other specified postprocedural states: Secondary | ICD-10-CM | POA: Insufficient documentation

## 2022-02-28 ENCOUNTER — Encounter: Payer: Self-pay | Admitting: Orthopedic Surgery

## 2022-02-28 ENCOUNTER — Ambulatory Visit (INDEPENDENT_AMBULATORY_CARE_PROVIDER_SITE_OTHER): Payer: PRIVATE HEALTH INSURANCE | Admitting: Orthopedic Surgery

## 2022-02-28 DIAGNOSIS — M238X2 Other internal derangements of left knee: Secondary | ICD-10-CM

## 2022-02-28 DIAGNOSIS — Z9889 Other specified postprocedural states: Secondary | ICD-10-CM

## 2022-02-28 NOTE — Patient Instructions (Addendum)
Physical therapy has been ordered for you at [facility] They should call you to schedule

## 2022-02-28 NOTE — Progress Notes (Signed)
Postop visit #1 arthroscopy left knee exam under anesthesia  Encounter Diagnoses  Name Primary?   S/P left knee arthroscopy 02/20/22 Yes   Laxity of left anterior cruciate ligament     Partial ACL tear  Stable Lachman and pivot shift test  Small lateral meniscal tear  Today patient is doing well mild swelling portals clean sutures out  Formal therapy  Return in 3 weeks measure for brace for return to work.

## 2022-03-07 ENCOUNTER — Encounter: Payer: Self-pay | Admitting: Orthopedic Surgery

## 2022-03-07 ENCOUNTER — Telehealth: Payer: Self-pay | Admitting: Orthopedic Surgery

## 2022-03-07 NOTE — Telephone Encounter (Signed)
Patient called this morning and requested a work note with light duty return date of 03/12/22; states if no light duty available, he will call back for continue out of work note and said he will have to use FMLA. Aware to pick up note.

## 2022-03-08 NOTE — Telephone Encounter (Signed)
Ok

## 2022-03-21 ENCOUNTER — Ambulatory Visit (INDEPENDENT_AMBULATORY_CARE_PROVIDER_SITE_OTHER): Payer: PRIVATE HEALTH INSURANCE | Admitting: Orthopedic Surgery

## 2022-03-21 DIAGNOSIS — M238X2 Other internal derangements of left knee: Secondary | ICD-10-CM

## 2022-03-21 DIAGNOSIS — Z9889 Other specified postprocedural states: Secondary | ICD-10-CM

## 2022-03-21 NOTE — Progress Notes (Addendum)
Chief Complaint  Patient presents with   Post-op Follow-up    Knee surgery     Status post left knee arthroscopy 822 with exam under anesthesia  The Lachman test and pivot shift test were normal under anesthesia with only a pivot glide.  On arthroscopy was found to have a small posterior lateral meniscus tear and a partial ACL tear  The patient continues to improve  He is having therapy at [facility]  He can now fully extend his leg and can fully bend it his anterior drawer test was normal  His leg measurements are 17 thigh, 13.5 mid knee and 14 mid calf  He is a candidate for ACL bracing, will need custom bracing secondary to quad calf ratio  He will continue therapy and light duty follow-up in 3 weeks to assess return to work status with the brace on.

## 2022-03-25 ENCOUNTER — Encounter: Payer: Self-pay | Admitting: Psychiatry

## 2022-03-25 ENCOUNTER — Other Ambulatory Visit: Payer: Self-pay | Admitting: Psychiatry

## 2022-04-11 ENCOUNTER — Encounter: Payer: Self-pay | Admitting: Orthopedic Surgery

## 2022-04-11 ENCOUNTER — Encounter: Payer: Self-pay | Admitting: Radiology

## 2022-04-11 ENCOUNTER — Ambulatory Visit (INDEPENDENT_AMBULATORY_CARE_PROVIDER_SITE_OTHER): Payer: PRIVATE HEALTH INSURANCE | Admitting: Orthopedic Surgery

## 2022-04-11 DIAGNOSIS — S83282D Other tear of lateral meniscus, current injury, left knee, subsequent encounter: Secondary | ICD-10-CM

## 2022-04-11 DIAGNOSIS — Z9889 Other specified postprocedural states: Secondary | ICD-10-CM

## 2022-04-11 DIAGNOSIS — M238X2 Other internal derangements of left knee: Secondary | ICD-10-CM

## 2022-04-11 NOTE — Patient Instructions (Signed)
OOW until full strength obtained

## 2022-04-11 NOTE — Progress Notes (Signed)
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

## 2022-04-16 ENCOUNTER — Ambulatory Visit (INDEPENDENT_AMBULATORY_CARE_PROVIDER_SITE_OTHER): Payer: PRIVATE HEALTH INSURANCE | Admitting: Psychiatry

## 2022-04-16 VITALS — BP 132/87 | HR 74 | Ht 68.0 in | Wt 168.2 lb

## 2022-04-16 DIAGNOSIS — G44229 Chronic tension-type headache, not intractable: Secondary | ICD-10-CM

## 2022-04-16 MED ORDER — DICLOFENAC POTASSIUM 50 MG PO TABS: ORAL_TABLET | ORAL | 6 refills | Status: DC

## 2022-04-16 MED ORDER — AMITRIPTYLINE HCL 25 MG PO TABS: 25.0000 mg | ORAL_TABLET | Freq: Every day | ORAL | 6 refills | Status: DC

## 2022-04-16 NOTE — Progress Notes (Signed)
   CC:  headaches  Follow-up Visit  Last visit: 11/16/21  Brief HPI: 36 year old male who follows in clinic for daily tension-type headaches. Brain MRI 09/19/21 was unremarkable.  At his last visit he was started on amitriptyline for headache prevention.  Interval History: Headaches have improved since his last visit. He is tolerating amitriptyline well without side effects. Recently tore his ACL and has been mainly working at his desk. Feels headaches have improved with decreased activity and wonders if his vest may have been contributing to neck pain. Prior to ACL tear he was having 2-3 headaches per week, but they were less severe than his previous headaches.   He finished neck PT in June which was helpful. Shooting pains have resolved. He hasn't had to take Robaxin this month.  Headache days per month: 0 Headache free days per month: 30  Current Headache Regimen: Preventative: amitriptyline 25 mg QHS Abortive: none   Prior Therapies                                  Topamax 50/75 - lack of efficacy Amitriptyline 25 mg QHS Robaxin 500 mg PRN Excedrin BC powder Neck PT  Physical Exam:   Vital Signs: BP 132/87   Pulse 74   Ht 5\' 8"  (1.727 m)   Wt 168 lb 4 oz (76.3 kg)   BMI 25.58 kg/m  GENERAL:  well appearing, in no acute distress, alert  SKIN:  Color, texture, turgor normal. No rashes or lesions HEAD:  Normocephalic/atraumatic. RESP: normal respiratory effort MSK:  No gross joint deformities.   NEUROLOGICAL: Mental Status: Alert, oriented to person, place and time, Follows commands, and Speech fluent and appropriate. Cranial Nerves: PERRL, face symmetric, no dysarthria, hearing grossly intact Motor: moves all extremities equally Gait: normal-based.     Latest Ref Rng & Units 02/16/2022    9:27 AM  BMP  Glucose 70 - 99 mg/dL 80   BUN 6 - 20 mg/dL 18   Creatinine 0.61 - 1.24 mg/dL 1.11   Sodium 135 - 145 mmol/L 140   Potassium 3.5 - 5.1 mmol/L 3.9    Chloride 98 - 111 mmol/L 108   CO2 22 - 32 mmol/L 26   Calcium 8.9 - 10.3 mg/dL 8.9       Latest Ref Rng & Units 02/16/2022    9:27 AM  CBC  WBC 4.0 - 10.5 K/uL 5.2   Hemoglobin 13.0 - 17.0 g/dL 16.0   Hematocrit 39.0 - 52.0 % 44.1   Platelets 150 - 400 K/uL 239      IMPRESSION: 36 year old male who presents for follow up of chronic tension-type headaches. He has had significant improvement with physical therapy and amitriptyline. Will continue amitriptyline for now and start diclofenac to take for rescue if his headaches return.   PLAN: -Prevention: continue amitriptyline 25 mg QHS -Rescue: Start diclofenac 50-100 mg PRN, continue robaxin 500 mg PRN    Follow-up: 3 months  I spent a total of 17 minutes on the date of the service. Headache education was done. Discussed treatment options including preventive and acute medications. Discussed medication side effects, adverse reactions and drug interactions. Written educational materials and patient instructions outlining all of the above were given.  Genia Harold, MD 04/16/22 3:49 PM

## 2022-04-16 NOTE — Patient Instructions (Signed)
Take diclofenac as needed for headaches. Take 1-2 pills at onset of headache

## 2022-05-02 ENCOUNTER — Telehealth: Payer: Self-pay | Admitting: Radiology

## 2022-05-02 NOTE — Telephone Encounter (Signed)
Patient called, said he is ready to go back to work.  Can he please have RTW note?  Please call him thanks.

## 2022-05-02 NOTE — Telephone Encounter (Signed)
Ok per Dr Natale Milch note to RTW full duty on 05/07/22

## 2022-07-05 NOTE — Progress Notes (Deleted)
CC:  headaches  Follow-up Visit  Last visit: 04/17/2019.  Dr. Billey Gosling  Brief HPI: 37 year old male who follows in clinic for daily tension-type headaches. Brain MRI 09/19/21 was unremarkable.  At his last visit, he continued on amitriptyline for preventative and Robaxin for rescue and started on diclofenac also for rescue   Interval History:    Headaches have improved since his last visit. He is tolerating amitriptyline well without side effects. Recently tore his ACL and has been mainly working at his desk. Feels headaches have improved with decreased activity and wonders if his vest may have been contributing to neck pain. Prior to ACL tear he was having 2-3 headaches per week, but they were less severe than his previous headaches.   He finished neck PT in June which was helpful. Shooting pains have resolved. He hasn't had to take Robaxin this month.  Headache days per month: 0 Headache free days per month: 30  Current Headache Regimen: Preventative: amitriptyline 25 mg QHS Abortive: Diclofenac, Robaxin   Prior Therapies                                  Topamax 50/75 - lack of efficacy Amitriptyline 25 mg QHS Robaxin 500 mg PRN Diclofenac *** Excedrin BC powder Neck PT  Current Outpatient Medications on File Prior to Visit  Medication Sig Dispense Refill   ALPRAZolam (XANAX) 0.5 MG tablet Take 0.5 mg by mouth 2 (two) times daily as needed for anxiety.     amitriptyline (ELAVIL) 25 MG tablet Take 1 tablet (25 mg total) by mouth at bedtime. 30 tablet 6   diclofenac (CATAFLAM) 50 MG tablet Take 1-2 pills at onset of headache. Max dose 2 pills in 24 hours 15 tablet 6   HYDROcodone-acetaminophen (NORCO/VICODIN) 5-325 MG tablet Take 1 tablet by mouth every 4 (four) hours as needed for moderate pain. (Patient not taking: Reported on 04/16/2022) 20 tablet 0   omeprazole (PRILOSEC) 20 MG capsule Take 20 mg by mouth daily.     polyvinyl alcohol (LIQUIFILM TEARS) 1.4 % ophthalmic  solution Place 1 drop into both eyes as needed for dry eyes.     No current facility-administered medications on file prior to visit.   Past Medical History:  Diagnosis Date   Complication of anesthesia    GERD (gastroesophageal reflux disease)    Headache    PONV (postoperative nausea and vomiting)    Past Surgical History:  Procedure Laterality Date   arm surgery Left    KNEE ARTHROSCOPY Left 02/20/2022   Procedure: ARTHROSCOPY KNEE;  Surgeon: Carole Civil, MD;  Location: [facility];  Service: Orthopedics;  Laterality: Left;   TESTICLE TORSION REDUCTION         Physical Exam:   Vital Signs: There were no vitals taken for this visit. GENERAL:  well appearing, in no acute distress, alert  SKIN:  Color, texture, turgor normal. No rashes or lesions HEAD:  Normocephalic/atraumatic. RESP: normal respiratory effort MSK:  No gross joint deformities.   NEUROLOGICAL: Mental Status: Alert, oriented to person, place and time, Follows commands, and Speech fluent and appropriate. Cranial Nerves: PERRL, face symmetric, no dysarthria, hearing grossly intact Motor: moves all extremities equally Gait: normal-based.     Latest Ref Rng & Units 02/16/2022    9:27 AM  BMP  Glucose 70 - 99 mg/dL 80   BUN 6 - 20 mg/dL 18   Creatinine  0.61 - 1.24 mg/dL 1.11   Sodium 135 - 145 mmol/L 140   Potassium 3.5 - 5.1 mmol/L 3.9   Chloride 98 - 111 mmol/L 108   CO2 22 - 32 mmol/L 26   Calcium 8.9 - 10.3 mg/dL 8.9       Latest Ref Rng & Units 02/16/2022    9:27 AM  CBC  WBC 4.0 - 10.5 K/uL 5.2   Hemoglobin 13.0 - 17.0 g/dL 16.0   Hematocrit 39.0 - 52.0 % 44.1   Platelets 150 - 400 K/uL 239      IMPRESSION: 37 year old male who presents for follow up of chronic tension-type headaches. He has had significant improvement with physical therapy and amitriptyline. Will continue amitriptyline for now and start diclofenac to take for rescue if his headaches return.    PLAN: -Prevention:  continue amitriptyline 25 mg QHS -Rescue: Start diclofenac 50-100 mg PRN, continue robaxin 500 mg PRN      I spent *** minutes of face-to-face and non-face-to-face time with patient.  This included previsit chart review, lab review, study review, order entry, electronic health record documentation, patient education  Frann Rider, [facility]  [facility] [address] [place], [place] [address]  Phone [phone] Fax [phone] Note: This document was prepared with digital dictation and possible smart phrase technology. Any transcriptional errors that result from this process are unintentional.

## 2022-07-09 ENCOUNTER — Ambulatory Visit: Payer: PRIVATE HEALTH INSURANCE | Admitting: Adult Health

## 2022-07-16 NOTE — Progress Notes (Unsigned)
CC:  headaches  Follow-up Visit  Last visit: 04/17/2019.  Dr. Billey Gosling  Brief HPI: 37 year old male who follows in clinic for daily tension-type headaches. Brain MRI 09/19/21 was unremarkable.  At his last visit, he continued on amitriptyline for preventative and Robaxin for rescue and started on diclofenac also for rescue   Interval History:  Doing well since prior visit.  Has been experiencing about 3 headaches per month.  Did take diclofenac for more severe headache with benefit but with milder headaches will do neck exercises with benefit.  Continues on amitriptyline 25 mg nightly, denies side effects.  No questions or concerns today.    Headache days per month: 3 Headache free days per month: 27  Current Headache Regimen: Preventative: amitriptyline 25 mg QHS Abortive: Diclofenac, Robaxin   Prior Therapies                                  Topamax 50/75 - lack of efficacy Amitriptyline 25 mg QHS Robaxin 500 mg PRN Diclofenac  Excedrin BC powder Neck PT  Current Outpatient Medications on File Prior to Visit  Medication Sig Dispense Refill   amitriptyline (ELAVIL) 25 MG tablet Take 1 tablet (25 mg total) by mouth at bedtime. 30 tablet 6   diclofenac (CATAFLAM) 50 MG tablet Take 1-2 pills at onset of headache. Max dose 2 pills in 24 hours 15 tablet 6   omeprazole (PRILOSEC) 20 MG capsule Take 20 mg by mouth daily.     polyvinyl alcohol (LIQUIFILM TEARS) 1.4 % ophthalmic solution Place 1 drop into both eyes as needed for dry eyes.     ALPRAZolam (XANAX) 0.5 MG tablet Take 0.5 mg by mouth 2 (two) times daily as needed for anxiety. (Patient not taking: Reported on 07/17/2022)     HYDROcodone-acetaminophen (NORCO/VICODIN) 5-325 MG tablet Take 1 tablet by mouth every 4 (four) hours as needed for moderate pain. (Patient not taking: Reported on 04/16/2022) 20 tablet 0   No current facility-administered medications on file prior to visit.   Past Medical History:  Diagnosis Date    Complication of anesthesia    GERD (gastroesophageal reflux disease)    Headache    PONV (postoperative nausea and vomiting)    Past Surgical History:  Procedure Laterality Date   arm surgery Left    KNEE ARTHROSCOPY Left 02/20/2022   Procedure: ARTHROSCOPY KNEE;  Surgeon: Carole Civil, MD;  Location: [facility];  Service: Orthopedics;  Laterality: Left;   TESTICLE TORSION REDUCTION         Physical Exam:   Vital Signs: BP 128/79 (BP Location: Left Arm, Patient Position: Sitting, Cuff Size: Normal)   Pulse 73   Ht 5\' 8"  (1.727 m)   Wt 170 lb 12.8 oz (77.5 kg)   BMI 25.97 kg/m  GENERAL:  well appearing, in no acute distress, alert  SKIN:  Color, texture, turgor normal. No rashes or lesions HEAD:  Normocephalic/atraumatic. RESP: normal respiratory effort MSK:  No gross joint deformities.   NEUROLOGICAL: Mental Status: Alert, oriented to person, place and time, Follows commands, and Speech fluent and appropriate. Cranial Nerves: PERRL, face symmetric, no dysarthria, hearing grossly intact Motor: moves all extremities equally Gait: normal-based.     Latest Ref Rng & Units 02/16/2022    9:27 AM  BMP  Glucose 70 - 99 mg/dL 80   BUN 6 - 20 mg/dL 18   Creatinine 0.61 - 1.24  mg/dL 1.11   Sodium 135 - 145 mmol/L 140   Potassium 3.5 - 5.1 mmol/L 3.9   Chloride 98 - 111 mmol/L 108   CO2 22 - 32 mmol/L 26   Calcium 8.9 - 10.3 mg/dL 8.9       Latest Ref Rng & Units 02/16/2022    9:27 AM  CBC  WBC 4.0 - 10.5 K/uL 5.2   Hemoglobin 13.0 - 17.0 g/dL 16.0   Hematocrit 39.0 - 52.0 % 44.1   Platelets 150 - 400 K/uL 239      IMPRESSION: 37 year old male who presents for follow up of chronic tension-type headaches. He has had significant improvement with physical therapy and amitriptyline. Will continue amitriptyline and diclofenac to take for rescue   PLAN: -Prevention: continue amitriptyline 25 mg QHS -Rescue: Continue diclofenac 50-100 mg PRN, continue robaxin 500 mg  PRN   Follow-up in 1 year or call earlier if needed    I spent 15 minutes of face-to-face and non-face-to-face time with patient.  This included previsit chart review, lab review, study review, order entry, electronic health record documentation, patient education and discussion regarding the above and answered all the questions to patient's satisfaction  Frann Rider, [facility]  [facility] [address] [place], [place] [address]  Phone [phone] Fax [phone] Note: This document was prepared with digital dictation and possible smart phrase technology. Any transcriptional errors that result from this process are unintentional.

## 2022-07-17 ENCOUNTER — Ambulatory Visit (INDEPENDENT_AMBULATORY_CARE_PROVIDER_SITE_OTHER): Payer: PRIVATE HEALTH INSURANCE | Admitting: Adult Health

## 2022-07-17 ENCOUNTER — Encounter: Payer: Self-pay | Admitting: Adult Health

## 2022-07-17 VITALS — BP 128/79 | HR 73 | Ht 68.0 in | Wt 170.8 lb

## 2022-07-17 DIAGNOSIS — G44229 Chronic tension-type headache, not intractable: Secondary | ICD-10-CM

## 2022-07-17 MED ORDER — AMITRIPTYLINE HCL 25 MG PO TABS: 25.0000 mg | ORAL_TABLET | Freq: Every day | ORAL | 3 refills | Status: DC

## 2022-07-17 NOTE — Patient Instructions (Addendum)
Continue amitriptyline 25mg  nightly -updated refill sent to your pharmacy  Continue use of diclofenac as needed for moderate to severe headaches    Follow-up in 1 year or call earlier if needed

## 2022-07-24 ENCOUNTER — Ambulatory Visit: Payer: PRIVATE HEALTH INSURANCE | Admitting: Adult Health

## 2022-08-28 ENCOUNTER — Ambulatory Visit
Admission: RE | Admit: 2022-08-28 | Discharge: 2022-08-28 | Disposition: A | Discharge status: Home / Self Care | Payer: PRIVATE HEALTH INSURANCE | Source: Ambulatory Visit | Attending: Nurse Practitioner | Admitting: Nurse Practitioner

## 2022-08-28 VITALS — BP 137/85 | HR 80 | Temp 97.6°F | Resp 18

## 2022-08-28 DIAGNOSIS — A084 Viral intestinal infection, unspecified: Secondary | ICD-10-CM | POA: Diagnosis not present

## 2022-08-28 MED ORDER — ONDANSETRON 4 MG PO TBDP: 4.0000 mg | ORAL_TABLET | Freq: Three times a day (TID) | ORAL | 0 refills | Status: DC | PRN

## 2022-08-28 NOTE — ED Triage Notes (Signed)
Reports severe cramping and diarrhea last week lasting a week. It got better then over the weekend it started again. Pts child had stomach bug before him.

## 2022-08-28 NOTE — Discharge Instructions (Signed)
As we discussed, I suspect you have a stomach bug.  Hopefully, symptoms will improve over the next few days.  Make sure you are drinking plenty of fluids.  If you want to eat, make sure to eat easy to digest food like pudding, applesauce, mashed potatoes, etc.  Please seek care if symptoms last more than 1 week without improvement or if you develop blood in your vomit or stool.

## 2022-08-28 NOTE — ED Provider Notes (Signed)
[facility]    CSN: [id number] Arrival date & time: 08/28/22  1016      History   Chief Complaint Chief Complaint  Patient presents with   Diarrhea    Entered by patient    HPI Medgar Bechen is a 37 y.o. male.   Patient presents today for 4-day history of nausea/vomiting and diarrhea.  Reports he had similar symptoms 08/19/2022 through 08/22/2022 that improved, symptoms only recurred 2 days later.  Reports more than 10 episodes of diarrhea today, however no blood.  Reports diarrhea is watery.  Has had some nausea, however no vomiting since 3 days ago.  No abdominal pain, fever, change in appetite currently.  He has been able to tolerate liquids today.  No recent foreign travel or recent antibiotic use.  Reports his son started with symptoms first, then he got sick, wife got sick, and then he got sick again.  Has not take anything for symptoms so far.  Reports anytime he eats, "it goes right through me."    Past Medical History:  Diagnosis Date   Complication of anesthesia    GERD (gastroesophageal reflux disease)    Headache    PONV (postoperative nausea and vomiting)     Patient Active Problem List   Diagnosis Date Noted   S/P left knee arthroscopy 02/20/22 02/27/2022   Tear of lateral meniscus of left knee, current    Left anterior cruciate ligament tear     Past Surgical History:  Procedure Laterality Date   arm surgery Left    KNEE ARTHROSCOPY Left 02/20/2022   Procedure: ARTHROSCOPY KNEE;  Surgeon: Carole Civil, MD;  Location: [facility];  Service: Orthopedics;  Laterality: Left;   TESTICLE TORSION REDUCTION         Home Medications    Prior to Admission medications   Medication Sig Start Date End Date Taking? Authorizing Provider  ondansetron (ZOFRAN-ODT) 4 MG disintegrating tablet Take 1 tablet (4 mg total) by mouth every 8 (eight) hours as needed for nausea or vomiting. 08/28/22  Yes Eulogio Bear, NP  amitriptyline (ELAVIL) 25 MG tablet  Take 1 tablet (25 mg total) by mouth at bedtime. 07/17/22   Frann Rider, NP  diclofenac (CATAFLAM) 50 MG tablet Take 1-2 pills at onset of headache. Max dose 2 pills in 24 hours 04/16/22   Genia Harold, MD  omeprazole (PRILOSEC) 20 MG capsule Take 20 mg by mouth daily.    [provider]  polyvinyl alcohol (LIQUIFILM TEARS) 1.4 % ophthalmic solution Place 1 drop into both eyes as needed for dry eyes.    [provider]    Family History Family History  Problem Relation Age of Onset   Diabetes Father    Hypertension Father     Social History Social History   Tobacco Use   Smoking status: Never   Smokeless tobacco: Never  Substance Use Topics   Alcohol use: No   Drug use: No     Allergies   Patient has no known allergies.   Review of Systems Review of Systems Per HPI  Physical Exam Triage Vital Signs ED Triage Vitals  Enc Vitals Group     BP 08/28/22 1030 137/85     Pulse Rate 08/28/22 1030 80     Resp 08/28/22 1030 18     Temp 08/28/22 1030 97.6 F (36.4 C)     Temp src --      SpO2 08/28/22 1030 97 %  Weight --      Height --      Head Circumference --      Peak Flow --      Pain Score 08/28/22 1028 0     Pain Loc --      Pain Edu? --      Excl. in [place]? --    Orthostatic VS for the past 24 hrs:  BP- Lying Pulse- Lying BP- Sitting Pulse- Sitting BP- Standing at 0 minutes Pulse- Standing at 0 minutes  08/28/22 1055 135/86 78 134/86 77 125/85 85    Updated Vital Signs BP 137/85   Pulse 80   Temp 97.6 F (36.4 C)   Resp 18   SpO2 97%   Visual Acuity Right Eye Distance:   Left Eye Distance:   Bilateral Distance:    Right Eye Near:   Left Eye Near:    Bilateral Near:     Physical Exam Vitals and nursing note reviewed.  Constitutional:      General: He is not in acute distress.    Appearance: Normal appearance. He is not toxic-appearing.  HENT:     Head: Normocephalic and atraumatic.     Mouth/Throat:     Mouth:  Mucous membranes are moist.     Pharynx: Oropharynx is clear. No posterior oropharyngeal erythema.  Cardiovascular:     Rate and Rhythm: Normal rate and regular rhythm.  Pulmonary:     Effort: Pulmonary effort is normal. No respiratory distress.     Breath sounds: Normal breath sounds. No wheezing, rhonchi or rales.  Abdominal:     General: Abdomen is flat. Bowel sounds are increased. There is no distension.     Palpations: Abdomen is soft.     Tenderness: There is abdominal tenderness in the right lower quadrant and left lower quadrant. There is no right CVA tenderness, left CVA tenderness, guarding or rebound. Negative signs include Murphy's sign, Rovsing's sign and McBurney's sign.  Musculoskeletal:     Cervical back: Normal range of motion.  Lymphadenopathy:     Cervical: No cervical adenopathy.  Skin:    General: Skin is warm and dry.     Capillary Refill: Capillary refill takes less than 2 seconds.     Coloration: Skin is not jaundiced or pale.     Findings: No erythema.  Neurological:     Mental Status: He is alert.     Motor: No weakness.     Gait: Gait normal.  Psychiatric:        Behavior: Behavior is cooperative.      UC Treatments / Results  Labs (all labs ordered are listed, but only abnormal results are displayed) Labs Reviewed - No data to display  EKG   Radiology No results found.  Procedures Procedures (including critical care time)  Medications Ordered in UC Medications - No data to display  Initial Impression / Assessment and Plan / UC Course  I have reviewed the triage vital signs and the nursing notes.  Pertinent labs & imaging results that were available during my care of the patient were reviewed by me and considered in my medical decision making (see chart for details).   Patient is well-appearing, normotensive, afebrile, not tachycardic, not tachypneic, oxygenating well on room air.   1. Viral gastroenteritis Suspect viral  gastroenteritis Orthostatic vital signs are reassuring today Examination is also reassuring Supportive care discussed Start Zofran ODT every 8 hours as needed for nausea or vomiting ER and return precautions discussed  with patient  The patient was given the opportunity to ask questions.  All questions answered to their satisfaction.  The patient is in agreement to this plan.    Final Clinical Impressions(s) / UC Diagnoses   Final diagnoses:  Viral gastroenteritis     Discharge Instructions      As we discussed, I suspect you have a stomach bug.  Hopefully, symptoms will improve over the next few days.  Make sure you are drinking plenty of fluids.  If you want to eat, make sure to eat easy to digest food like pudding, applesauce, mashed potatoes, etc.  Please seek care if symptoms last more than 1 week without improvement or if you develop blood in your vomit or stool.     ED Prescriptions     Medication Sig Dispense Auth. Provider   ondansetron (ZOFRAN-ODT) 4 MG disintegrating tablet Take 1 tablet (4 mg total) by mouth every 8 (eight) hours as needed for nausea or vomiting. 20 tablet Eulogio Bear, NP      PDMP not reviewed this encounter.   Eulogio Bear, NP 08/28/22 1139

## 2022-08-30 ENCOUNTER — Encounter: Payer: Self-pay | Admitting: Radiology

## 2022-12-25 ENCOUNTER — Ambulatory Visit (HOSPITAL_COMMUNITY)
Admission: RE | Admit: 2022-12-25 | Discharge: 2022-12-25 | Disposition: A | Discharge status: Home / Self Care | Payer: PRIVATE HEALTH INSURANCE | Source: Ambulatory Visit | Attending: Family Medicine | Admitting: Family Medicine

## 2022-12-25 ENCOUNTER — Other Ambulatory Visit (HOSPITAL_COMMUNITY): Payer: Self-pay | Admitting: Family Medicine

## 2022-12-25 DIAGNOSIS — R1010 Upper abdominal pain, unspecified: Secondary | ICD-10-CM | POA: Diagnosis present

## 2022-12-25 DIAGNOSIS — M79661 Pain in right lower leg: Secondary | ICD-10-CM | POA: Insufficient documentation

## 2022-12-31 ENCOUNTER — Other Ambulatory Visit (HOSPITAL_COMMUNITY): Payer: Self-pay | Admitting: Family Medicine

## 2022-12-31 DIAGNOSIS — R1010 Upper abdominal pain, unspecified: Secondary | ICD-10-CM

## 2023-01-23 ENCOUNTER — Ambulatory Visit (INDEPENDENT_AMBULATORY_CARE_PROVIDER_SITE_OTHER): Payer: PRIVATE HEALTH INSURANCE | Admitting: Gastroenterology

## 2023-01-23 ENCOUNTER — Encounter: Payer: Self-pay | Admitting: *Deleted

## 2023-01-23 ENCOUNTER — Encounter: Payer: Self-pay | Admitting: Gastroenterology

## 2023-01-23 ENCOUNTER — Telehealth: Payer: Self-pay | Admitting: *Deleted

## 2023-01-23 VITALS — BP 134/86 | HR 50 | Temp 97.7°F | Ht 68.0 in | Wt 162.0 lb

## 2023-01-23 DIAGNOSIS — R1319 Other dysphagia: Secondary | ICD-10-CM

## 2023-01-23 DIAGNOSIS — R1013 Epigastric pain: Secondary | ICD-10-CM

## 2023-01-23 DIAGNOSIS — K219 Gastro-esophageal reflux disease without esophagitis: Secondary | ICD-10-CM | POA: Diagnosis not present

## 2023-01-23 DIAGNOSIS — R7401 Elevation of levels of liver transaminase levels: Secondary | ICD-10-CM | POA: Diagnosis not present

## 2023-01-23 NOTE — Progress Notes (Signed)
GI Office Note    Referring Provider: Assunta Found, MD Primary Care Physician:  Assunta Found, MD  Primary Gastroenterologist: Dr. Marletta Lor  Chief Complaint   Chief Complaint  Patient presents with   Abdominal Pain    Was having shooting pains that would go up his chest. PCP referred to [place] thinking reflux. Currently takes prilosec 20 mg once daily.     History of Present Illness   Tony Stephens is a 37 y.o. male presenting today at the request of Dr. Phillips Odor for further evalaution of abdominal pain, needs EGD.   Patient states he has had chronic GERD for years, has been on omeprazole for more than 5 years.  He has never had an upper endoscopy.  At times he has a desire to coming off medication but does not seem to tolerate it.  Recently started having pain in the epigastric region shooting up into the chest.  He became alarmed and was concerned about the possibility of cardiac etiology.  He was seen by PCP, completed chest x-ray and EKG which were unremarkable per patient.  PCP increased his omeprazole to twice daily and after about a week his symptoms started settling down.  He has since went back to once daily dosing.  Bowel movements are regular.  No melena or rectal bleeding.  He has occasional dysphagia but not that often.  Labs June 2024: White blood cell count 5800, hemoglobin 17.3, platelets 243,000, BUN 17, creatinine 1.05, sodium 139, potassium 5, albumin 4.6, total bilirubin 0.6, alkaline phosphatase 106, AST 36, ALT 53 high, TSH 1.060, lipase 32. LFTs normal twice in 2023.   Medications   Current Outpatient Medications  Medication Sig Dispense Refill   amitriptyline (ELAVIL) 25 MG tablet Take 1 tablet (25 mg total) by mouth at bedtime. 90 tablet 3   omeprazole (PRILOSEC) 20 MG capsule Take 20 mg by mouth daily.     polyvinyl alcohol (LIQUIFILM TEARS) 1.4 % ophthalmic solution Place 1 drop into both eyes as needed for dry eyes.     No current facility-administered  medications for this visit.    Allergies   Allergies as of 01/23/2023   (No Known Allergies)    Past Medical History   Past Medical History:  Diagnosis Date   Complication of anesthesia    GERD (gastroesophageal reflux disease)    Headache    PONV (postoperative nausea and vomiting)     Past Surgical History   Past Surgical History:  Procedure Laterality Date   arm surgery Left    KNEE ARTHROSCOPY Left 02/20/2022   Procedure: ARTHROSCOPY KNEE;  Surgeon: Vickki Hearing, MD;  Location: [facility];  Service: Orthopedics;  Laterality: Left;   TESTICLE TORSION REDUCTION      Past Family History   Family History  Problem Relation Age of Onset   Diabetes Father    Hypertension Father    Esophageal cancer Neg Hx    Colon cancer Neg Hx     Past Social History   Social History   Socioeconomic History   Marital status: Married    Spouse name: Amy   Number of children: 2   Years of education: Not on file   Highest education level: Some college, no degree  Occupational History   Not on file  Tobacco Use   Smoking status: Never   Smokeless tobacco: Never  Substance and Sexual Activity   Alcohol use: No   Drug use: No   Sexual activity: Yes  Other Topics Concern   Not on file  Social History Narrative   Lives with family   Caffeine- maybe 1 soda a few days a week   Social Determinants of Health   Financial Resource Strain: Not on file  Food Insecurity: Not on file  Transportation Needs: Not on file  Physical Activity: Not on file  Stress: Not on file  Social Connections: Not on file  Intimate Partner Violence: Not on file    Review of Systems   General: Negative for anorexia, weight loss, fever, chills, fatigue, weakness. Eyes: Negative for vision changes.  ENT: Negative for hoarseness, nasal congestion. See hpi CV: Negative for chest pain, angina, palpitations, dyspnea on exertion, peripheral edema.  Respiratory: Negative for dyspnea at rest, dyspnea  on exertion, cough, sputum, wheezing.  GI: See history of present illness. GU:  Negative for dysuria, hematuria, urinary incontinence, urinary frequency, nocturnal urination.  MS: Negative for joint pain, low back pain.  Derm: Negative for rash or itching.  Neuro: Negative for weakness, abnormal sensation, seizure, frequent headaches, memory loss,  confusion.  Psych: Negative for anxiety, depression, suicidal ideation, hallucinations.  Endo: Negative for unusual weight change.  Heme: Negative for bruising or bleeding. Allergy: Negative for rash or hives.  Physical Exam   BP 134/86 (BP Location: Right Arm, Patient Position: Sitting, Cuff Size: Normal)   Pulse (!) 50   Temp 97.7 F (36.5 C) (Oral)   Ht 5\' 8"  (1.727 m)   Wt 162 lb (73.5 kg)   SpO2 98%   BMI 24.63 kg/m    General: Well-nourished, well-developed in no acute distress.  Head: Normocephalic, atraumatic.   Eyes: Conjunctiva pink, no icterus. Mouth: Oropharyngeal mucosa moist and pink   Neck: Supple without thyromegaly, masses, or lymphadenopathy.  Lungs: Clear to auscultation bilaterally.  Heart: Regular rate and rhythm, no murmurs rubs or gallops.  Abdomen: Bowel sounds are normal, nontender, nondistended, no hepatosplenomegaly or masses,  no abdominal bruits or hernia, no rebound or guarding.   Rectal: not performed Extremities: No lower extremity edema. No clubbing or deformities.  Neuro: Alert and oriented x 4 , grossly normal neurologically.  Skin: Warm and dry, no rash or jaundice.   Psych: Alert and cooperative, normal mood and affect.  Labs   See hpi  Imaging Studies   DG Knee 3 Views Right  Result Date: 12/30/2022 CLINICAL DATA:  Right knee pain, no known injury, initial encounter EXAM: RIGHT KNEE - 3 VIEW COMPARISON:  None Available. FINDINGS: No evidence of fracture, dislocation, or joint effusion. No evidence of arthropathy or other focal bone abnormality. Soft tissues are unremarkable. IMPRESSION:  No acute abnormality noted. Electronically Signed   By: Alcide Clever M.D.   On: 12/30/2022 03:29   DG Chest 2 View  Result Date: 12/30/2022 CLINICAL DATA:  Chest pain EXAM: CHEST - 2 VIEW COMPARISON:  09/05/2021 FINDINGS: The heart size and mediastinal contours are within normal limits. Both lungs are clear. The visualized skeletal structures are unremarkable. IMPRESSION: No active cardiopulmonary disease. Electronically Signed   By: Alcide Clever M.D.   On: 12/30/2022 03:28    Assessment   *Chronic GERD *Esophageal dysphagia, occasional *Epigastric pain/chest pain *Elevated ALT  Chronic GERD, typically well-controlled although patient desires coming off medication at some point in the future.  So far he has not been able to wean himself off.  Recently while on a daily PPI, he developed epigastric pain radiating into the chest which prompted him to see his primary care  physician.  PPI was increased to twice daily.  After about a week he much improved and was able to decrease PPI to once daily dosing.  Work up did show mild isolated elevated ALT, previously had been last year 2 years medications.  Order was placed by PCP for U/S but this was not scheduled or completed.  Patient did not mention this information during the visit today, not clear if he is aware, we will reach out.  He has occasional esophageal dysphagia.  We discussed potential upper endoscopy to evaluate his chronic GERD screen for hiatal hernia, esophageal strictures, Barrett's esophagus, EOE.  I suspect recent epigastric/chest pain related to reflux disease but cannot exclude possibility of biliary etiology given mild bump in ALT. Patient may be interested in TIF in the future if he is a candidate.     PLAN   Upper endoscopy with possible esophageal dilation with Dr. Marletta Lor.  ASA 1.  I have discussed the risks, alternatives, benefits with regards to but not limited to the risk of reaction to medication, bleeding, infection,  perforation and the patient is agreeable to proceed. Written consent to be obtained. Continue omeprazole 20 mg daily. Reinforced antireflux measures.   Leanna Battles. Melvyn Neth, MHS, PA-C [facility]

## 2023-01-23 NOTE — Telephone Encounter (Signed)
Per availity no PA required for procedure

## 2023-01-23 NOTE — Patient Instructions (Signed)
Continue omeprazole 20 mg daily for now. Upper endoscopy to be scheduled.

## 2023-01-24 ENCOUNTER — Encounter: Payer: Self-pay | Admitting: Gastroenterology

## 2023-01-25 NOTE — Telephone Encounter (Signed)
Please release labs

## 2023-02-04 ENCOUNTER — Encounter (HOSPITAL_COMMUNITY): Payer: Self-pay | Admitting: Anesthesiology

## 2023-02-04 ENCOUNTER — Encounter: Payer: Self-pay | Admitting: *Deleted

## 2023-02-04 ENCOUNTER — Telehealth: Payer: Self-pay | Admitting: *Deleted

## 2023-02-04 NOTE — OR Nursing (Signed)
[facility] called endoscopy dept to cancel procedule due to death in family.

## 2023-02-04 NOTE — Telephone Encounter (Signed)
Received message from endo stating "this patient just called and has a death in the family and wants to reschedule his procedure". Pt on for tomorrow with Dr. Marletta Lor. Will call to reschedule once we receive sept schedule.

## 2023-02-05 ENCOUNTER — Encounter (HOSPITAL_COMMUNITY): Admission: RE | Payer: Self-pay | Source: Home / Self Care

## 2023-02-05 ENCOUNTER — Ambulatory Visit (HOSPITAL_COMMUNITY): Admission: RE | Admit: 2023-02-05 | Payer: No Typology Code available for payment source | Source: Home / Self Care

## 2023-02-05 DIAGNOSIS — K219 Gastro-esophageal reflux disease without esophagitis: Secondary | ICD-10-CM

## 2023-02-05 DIAGNOSIS — R1013 Epigastric pain: Secondary | ICD-10-CM

## 2023-02-05 DIAGNOSIS — R1319 Other dysphagia: Secondary | ICD-10-CM

## 2023-02-05 SURGERY — ESOPHAGOGASTRODUODENOSCOPY (EGD) WITH PROPOFOL
Anesthesia: Monitor Anesthesia Care

## 2023-02-26 ENCOUNTER — Encounter: Payer: Self-pay | Admitting: *Deleted

## 2023-03-14 ENCOUNTER — Other Ambulatory Visit: Payer: Self-pay | Admitting: *Deleted

## 2023-03-14 DIAGNOSIS — R1013 Epigastric pain: Secondary | ICD-10-CM

## 2023-03-14 DIAGNOSIS — R7989 Other specified abnormal findings of blood chemistry: Secondary | ICD-10-CM

## 2023-03-14 NOTE — Telephone Encounter (Signed)
Please schedule abd u/s for dx epig pain and h/o abnormal LFTs.

## 2023-03-22 ENCOUNTER — Ambulatory Visit (HOSPITAL_COMMUNITY)
Admission: RE | Admit: 2023-03-22 | Discharge: 2023-03-22 | Disposition: A | Discharge status: Home / Self Care | Payer: 59 | Source: Ambulatory Visit | Attending: Gastroenterology | Admitting: Gastroenterology

## 2023-03-22 DIAGNOSIS — R1013 Epigastric pain: Secondary | ICD-10-CM | POA: Insufficient documentation

## 2023-03-22 DIAGNOSIS — R7989 Other specified abnormal findings of blood chemistry: Secondary | ICD-10-CM | POA: Insufficient documentation

## 2023-03-28 ENCOUNTER — Other Ambulatory Visit: Payer: Self-pay

## 2023-03-28 ENCOUNTER — Ambulatory Visit (HOSPITAL_COMMUNITY)
Admission: RE | Admit: 2023-03-28 | Discharge: 2023-03-28 | Disposition: A | Discharge status: Home / Self Care | Payer: No Typology Code available for payment source | Attending: Internal Medicine | Admitting: Internal Medicine

## 2023-03-28 ENCOUNTER — Encounter (HOSPITAL_COMMUNITY): Payer: Self-pay

## 2023-03-28 ENCOUNTER — Ambulatory Visit (HOSPITAL_COMMUNITY): Payer: No Typology Code available for payment source | Admitting: Certified Registered"

## 2023-03-28 ENCOUNTER — Encounter (HOSPITAL_COMMUNITY)
Admission: RE | Disposition: A | Discharge status: Home / Self Care | Payer: Self-pay | Source: Home / Self Care | Attending: Internal Medicine

## 2023-03-28 ENCOUNTER — Ambulatory Visit (HOSPITAL_BASED_OUTPATIENT_CLINIC_OR_DEPARTMENT_OTHER): Payer: No Typology Code available for payment source | Admitting: Certified Registered"

## 2023-03-28 DIAGNOSIS — K219 Gastro-esophageal reflux disease without esophagitis: Secondary | ICD-10-CM | POA: Diagnosis not present

## 2023-03-28 DIAGNOSIS — K2970 Gastritis, unspecified, without bleeding: Secondary | ICD-10-CM | POA: Diagnosis not present

## 2023-03-28 DIAGNOSIS — K2950 Unspecified chronic gastritis without bleeding: Secondary | ICD-10-CM

## 2023-03-28 DIAGNOSIS — K222 Esophageal obstruction: Secondary | ICD-10-CM | POA: Diagnosis not present

## 2023-03-28 DIAGNOSIS — R1310 Dysphagia, unspecified: Secondary | ICD-10-CM | POA: Diagnosis not present

## 2023-03-28 DIAGNOSIS — R1013 Epigastric pain: Secondary | ICD-10-CM

## 2023-03-28 HISTORY — PX: ESOPHAGOGASTRODUODENOSCOPY (EGD) WITH PROPOFOL: SHX5813

## 2023-03-28 HISTORY — PX: BALLOON DILATION: SHX5330

## 2023-03-28 HISTORY — PX: BIOPSY: SHX5522

## 2023-03-28 SURGERY — ESOPHAGOGASTRODUODENOSCOPY (EGD) WITH PROPOFOL
Anesthesia: General

## 2023-03-28 MED ORDER — LIDOCAINE 2% (20 MG/ML) 5 ML SYRINGE
INTRAMUSCULAR | Status: DC | PRN
  Administered 2023-03-28: 100 mg via INTRAVENOUS

## 2023-03-28 MED ORDER — PANTOPRAZOLE SODIUM 40 MG PO TBEC: 40.0000 mg | DELAYED_RELEASE_TABLET | Freq: Two times a day (BID) | ORAL | 11 refills | Status: DC

## 2023-03-28 MED ORDER — LIDOCAINE HCL (PF) 2 % IJ SOLN
INTRAMUSCULAR | Status: AC
  Filled 2023-03-28: qty 5

## 2023-03-28 MED ORDER — PROPOFOL 500 MG/50ML IV EMUL
INTRAVENOUS | Status: AC
  Filled 2023-03-28: qty 50

## 2023-03-28 MED ORDER — PROPOFOL 10 MG/ML IV BOLUS
INTRAVENOUS | Status: DC | PRN
  Administered 2023-03-28: 100 mg via INTRAVENOUS
  Administered 2023-03-28: 200 mg via INTRAVENOUS

## 2023-03-28 MED ORDER — LACTATED RINGERS IV SOLN: INTRAVENOUS | Status: DC

## 2023-03-28 NOTE — Op Note (Signed)
[facility] Patient Name: Tony Stephens Procedure Date: 03/28/2023 1:00 PM MRN: [id number] Date of Birth: 1986/06/05 Attending MD: Hennie Duos. Marletta Lor , [place], [id number] CSN: [id number] Age: 37 Admit Type: Outpatient Procedure:                Upper GI endoscopy Indications:              Epigastric abdominal pain, Dysphagia, Heartburn Providers:                Hennie Duos. Marletta Lor, DO, Sheran Fava, Zena Amos Referring MD:              Medicines:                See the Anesthesia note for documentation of the                            administered medications Complications:            No immediate complications. Estimated Blood Loss:     Estimated blood loss was minimal. Procedure:                Pre-Anesthesia Assessment:                           - The anesthesia plan was to use monitored                            anesthesia care (MAC).                           After obtaining informed consent, the endoscope was                            passed under direct vision. Throughout the                            procedure, the patient's blood pressure, pulse, and                            oxygen saturations were monitored continuously. The                            GIF-H190 ([id number]) scope was introduced through the                            mouth, and advanced to the second part of duodenum.                            The upper GI endoscopy was accomplished without                            difficulty. The patient tolerated the procedure                            well. Scope In: 1:11:44  PM Scope Out: 1:17:41 PM Total Procedure Duration: 0 hours 5 minutes 57 seconds  Findings:      A mild Schatzki ring was found in the distal esophagus. A TTS dilator       was passed through the scope. Dilation with an 18-19-20 mm balloon       dilator was performed to 20 mm. The dilation site was examined and       showed mild mucosal disruption and  moderate improvement in luminal       narrowing.      Biopsies were taken with a cold forceps in the middle third of the       esophagus for histology.      Patchy mild inflammation characterized by erythema was found in the       gastric body and in the gastric antrum. Biopsies were taken with a cold       forceps for Helicobacter pylori testing.      The duodenal bulb, first portion of the duodenum and second portion of       the duodenum were normal. Impression:               - Mild Schatzki ring. Dilated.                           - Gastritis. Biopsied.                           - Normal duodenal bulb, first portion of the                            duodenum and second portion of the duodenum.                           - Biopsies were taken with a cold forceps for                            histology in the middle third of the esophagus. Moderate Sedation:      Per Anesthesia Care Recommendation:           - Patient has a contact number available for                            emergencies. The signs and symptoms of potential                            delayed complications were discussed with the                            patient. Return to normal activities tomorrow.                            Written discharge instructions were provided to the                            patient.                           - Resume previous diet.                           -  Continue present medications.                           - Await pathology results.                           - Repeat upper endoscopy PRN for retreatment.                           - Return to GI clinic in 3 months.                           - Use Protonix (pantoprazole) 40 mg PO BID. Procedure Code(s):        --- Professional ---                           [phone], Esophagogastroduodenoscopy, flexible,                            transoral; with transendoscopic balloon dilation of                            esophagus (less than  30 mm diameter)                           43239, 59, Esophagogastroduodenoscopy, flexible,                            transoral; with biopsy, single or multiple Diagnosis Code(s):        --- Professional ---                           K22.2, Esophageal obstruction                           K29.70, Gastritis, unspecified, without bleeding                           R10.13, Epigastric pain                           R13.10, Dysphagia, unspecified                           R12, Heartburn CPT copyright 2022 American Medical Association. All rights reserved. The codes documented in this report are preliminary and upon coder review may  be revised to meet current compliance requirements. Hennie Duos. Marletta Lor, DO Hennie Duos. Marletta Lor, DO 03/28/2023 1:23:30 PM This report has been signed electronically. Number of Addenda: 0

## 2023-03-28 NOTE — Transfer of Care (Signed)
Immediate Anesthesia Transfer of Care Note  Patient: Tony Stephens  Procedure(s) Performed: ESOPHAGOGASTRODUODENOSCOPY (EGD) WITH PROPOFOL BALLOON DILATION BIOPSY  Patient Location: Endoscopy Unit  Anesthesia Type:General  Level of Consciousness: awake, alert , and oriented  Airway & Oxygen Therapy: Patient Spontanous Breathing  Post-op Assessment: Report given to RN and Post -op Vital signs reviewed and stable  Post vital signs: Reviewed and stable  Last Vitals:  Vitals Value Taken Time  BP    Temp    Pulse    Resp    SpO2      Last Pain:  Vitals:   03/28/23 1305  TempSrc:   PainSc: 0-No pain      Patients Stated Pain Goal: 7 (03/28/23 1240)  Complications: No notable events documented.

## 2023-03-28 NOTE — Anesthesia Preprocedure Evaluation (Signed)
Anesthesia Evaluation  Patient identified by MRN, date of birth, ID band Patient awake    Reviewed: Allergy & Precautions, H&P , NPO status , Patient's Chart, lab work & pertinent test results, reviewed documented beta blocker date and time   History of Anesthesia Complications (+) PONV and history of anesthetic complications  Airway Mallampati: II  TM Distance: >3 FB Neck ROM: full    Dental no notable dental hx.    Pulmonary neg pulmonary ROS   Pulmonary exam normal breath sounds clear to auscultation       Cardiovascular Exercise Tolerance: Good negative cardio ROS  Rhythm:regular Rate:Normal     Neuro/Psych  Headaches negative neurological ROS  negative psych ROS   GI/Hepatic negative GI ROS, Neg liver ROS,GERD  ,,  Endo/Other  negative endocrine ROS    Renal/GU negative Renal ROS  negative genitourinary   Musculoskeletal   Abdominal   Peds  Hematology negative hematology ROS (+)   Anesthesia Other Findings   Reproductive/Obstetrics negative OB ROS                             Anesthesia Physical Anesthesia Plan  ASA: 2  Anesthesia Plan: General   Post-op Pain Management:    Induction:   PONV Risk Score and Plan: Propofol infusion  Airway Management Planned:   Additional Equipment:   Intra-op Plan:   Post-operative Plan:   Informed Consent: I have reviewed the patients History and Physical, chart, labs and discussed the procedure including the risks, benefits and alternatives for the proposed anesthesia with the patient or authorized representative who has indicated his/her understanding and acceptance.     Dental Advisory Given  Plan Discussed with: CRNA  Anesthesia Plan Comments:        Anesthesia Quick Evaluation

## 2023-03-28 NOTE — Discharge Instructions (Addendum)
EGD Discharge instructions Please read the instructions outlined below and refer to this sheet in the next few weeks. These discharge instructions provide you with general information on caring for yourself after you leave the hospital. Your doctor may also give you specific instructions. While your treatment has been planned according to the most current medical practices available, unavoidable complications occasionally occur. If you have any problems or questions after discharge, please call your doctor. ACTIVITY You may resume your regular activity but move at a slower pace for the next 24 hours.  Take frequent rest periods for the next 24 hours.  Walking will help expel (get rid of) the air and reduce the bloated feeling in your abdomen.  No driving for 24 hours (because of the anesthesia (medicine) used during the test).  You may shower.  Do not sign any important legal documents or operate any machinery for 24 hours (because of the anesthesia used during the test).  NUTRITION Drink plenty of fluids.  You may resume your normal diet.  Begin with a light meal and progress to your normal diet.  Avoid alcoholic beverages for 24 hours or as instructed by your caregiver.  MEDICATIONS You may resume your normal medications unless your caregiver tells you otherwise.  WHAT YOU CAN EXPECT TODAY You may experience abdominal discomfort such as a feeling of fullness or "gas" pains.  FOLLOW-UP Your doctor will discuss the results of your test with you.  SEEK IMMEDIATE MEDICAL ATTENTION IF ANY OF THE FOLLOWING OCCUR: Excessive nausea (feeling sick to your stomach) and/or vomiting.  Severe abdominal pain and distention (swelling).  Trouble swallowing.  Temperature over 101 F (37.8 C).  Rectal bleeding or vomiting of blood.   Your EGD revealed mild amount inflammation in your stomach.  I took biopsies of this to rule out infection with a bacteria called H. pylori.  I also took samples of your  esophagus to rule out a condition called eosinophilic esophagitis.  Await pathology results, my office will contact you.  You did have a mild tightening of your esophagus called a Schatzki's ring.  I stretched this out today.  Hopefully this helps with feeling of food getting stuck.  Small bowel appeared normal.  I am going to change your omeprazole to pantoprazole twice daily and have sent this to your pharmacy.  Follow-up in GI office in 2 to 3 months  Office will call with appointment  I hope you have a great rest of your week!  Tony Stephens. Marletta Lor, D.O. Gastroenterology and Hepatology [facility]

## 2023-03-28 NOTE — H&P (Signed)
Primary Care Physician:  Assunta Found, MD Primary Gastroenterologist:  Dr. Marletta Lor  Pre-Procedure History & Physical: HPI:  Tony Stephens is a 37 y.o. male is here for an EGD with possible dilation due to history of dysphagia. GERD, epigastric pain  Past Medical History:  Diagnosis Date   Complication of anesthesia    GERD (gastroesophageal reflux disease)    Headache    PONV (postoperative nausea and vomiting)     Past Surgical History:  Procedure Laterality Date   arm surgery Left    KNEE ARTHROSCOPY Left 02/20/2022   Procedure: ARTHROSCOPY KNEE;  Surgeon: Vickki Hearing, MD;  Location: [facility];  Service: Orthopedics;  Laterality: Left;   TESTICLE TORSION REDUCTION      Prior to Admission medications   Medication Sig Start Date End Date Taking? Authorizing Provider  amitriptyline (ELAVIL) 25 MG tablet Take 1 tablet (25 mg total) by mouth at bedtime. 07/17/22   Ihor Austin, NP  omeprazole (PRILOSEC) 20 MG capsule Take 20 mg by mouth daily.    [provider]  polyvinyl alcohol (LIQUIFILM TEARS) 1.4 % ophthalmic solution Place 1 drop into both eyes as needed for dry eyes.    [provider]    Allergies as of 02/26/2023   (No Known Allergies)    Family History  Problem Relation Age of Onset   Diabetes Father    Hypertension Father    Esophageal cancer Neg Hx    Colon cancer Neg Hx     Social History   Socioeconomic History   Marital status: Married    Spouse name: Amy   Number of children: 2   Years of education: Not on file   Highest education level: Some college, no degree  Occupational History   Not on file  Tobacco Use   Smoking status: Never   Smokeless tobacco: Never  Substance and Sexual Activity   Alcohol use: No   Drug use: No   Sexual activity: Yes  Other Topics Concern   Not on file  Social History Narrative   Lives with family   Caffeine- maybe 1 soda a few days a week   Social Determinants of Health   Financial  Resource Strain: Not on file  Food Insecurity: Not on file  Transportation Needs: Not on file  Physical Activity: Not on file  Stress: Not on file  Social Connections: Not on file  Intimate Partner Violence: Not on file    Review of Systems: General: Negative for fever, chills, fatigue, weakness. Eyes: Negative for vision changes.  ENT: Negative for hoarseness, difficulty swallowing , nasal congestion. CV: Negative for chest pain, angina, palpitations, dyspnea on exertion, peripheral edema.  Respiratory: Negative for dyspnea at rest, dyspnea on exertion, cough, sputum, wheezing.  GI: See history of present illness. GU:  Negative for dysuria, hematuria, urinary incontinence, urinary frequency, nocturnal urination.  MS: Negative for joint pain, low back pain.  Derm: Negative for rash or itching.  Neuro: Negative for weakness, abnormal sensation, seizure, frequent headaches, memory loss, confusion.  Psych: Negative for anxiety, depression Endo: Negative for unusual weight change.  Heme: Negative for bruising or bleeding. Allergy: Negative for rash or hives.  Physical Exam: Vital signs in last 24 hours: BP: ()/()  Arterial Line BP: ()/()    General:   Alert,  Well-developed, well-nourished, pleasant and cooperative in NAD Head:  Normocephalic and atraumatic. Eyes:  Sclera clear, no icterus.   Conjunctiva pink. Ears:  Normal auditory acuity. Nose:  No deformity,  discharge,  or lesions. Msk:  Symmetrical without gross deformities. Normal posture. Extremities:  Without clubbing or edema. Neurologic:  Alert and  oriented x4;  grossly normal neurologically. Skin:  Intact without significant lesions or rashes. Psych:  Alert and cooperative. Normal mood and affect.   Impression/Plan: Tony Stephens is here for an EGD with possible dilation due to history of dysphagia. GERD, epigastric pain  Risks, benefits, limitations, imponderables and alternatives regarding procedure have been  reviewed with the patient. Questions have been answered. All parties agreeable.

## 2023-03-29 LAB — SURGICAL PATHOLOGY

## 2023-04-01 NOTE — Anesthesia Postprocedure Evaluation (Signed)
Anesthesia Post Note  Patient: Tony Stephens  Procedure(s) Performed: ESOPHAGOGASTRODUODENOSCOPY (EGD) WITH PROPOFOL BALLOON DILATION BIOPSY  Patient location during evaluation: Phase II Anesthesia Type: General Level of consciousness: awake Pain management: pain level controlled Vital Signs Assessment: post-procedure vital signs reviewed and stable Respiratory status: spontaneous breathing and respiratory function stable Cardiovascular status: blood pressure returned to baseline and stable Postop Assessment: no headache and no apparent nausea or vomiting Anesthetic complications: no Comments: Late entry   No notable events documented.   Last Vitals:  Vitals:   03/28/23 1240 03/28/23 1321  BP: 130/79 (!) 100/52  Pulse: 70 80  Resp: 16 17  Temp: 36.8 C 36.7 C  SpO2: 99% 97%    Last Pain:  Vitals:   03/28/23 1321  TempSrc: Oral  PainSc: 0-No pain                 Windell Norfolk

## 2023-04-05 ENCOUNTER — Encounter (HOSPITAL_COMMUNITY): Payer: Self-pay | Admitting: Internal Medicine

## 2023-05-21 ENCOUNTER — Encounter: Payer: Self-pay | Admitting: Internal Medicine

## 2023-06-25 IMAGING — DX DG LUMBAR SPINE 2-3V
3 series · 3 of 3 positions shown · non-contrast
Comparison: X-ray lumbar 10/01/2016.

CLINICAL DATA: Low back pain; unspecified back pain laterality,
unspecified chronicity, unspecified whether sciatica present. Lower
back pain for 1 month.

EXAM:
LUMBAR SPINE - 2-3 VIEW

[l-spine ap]
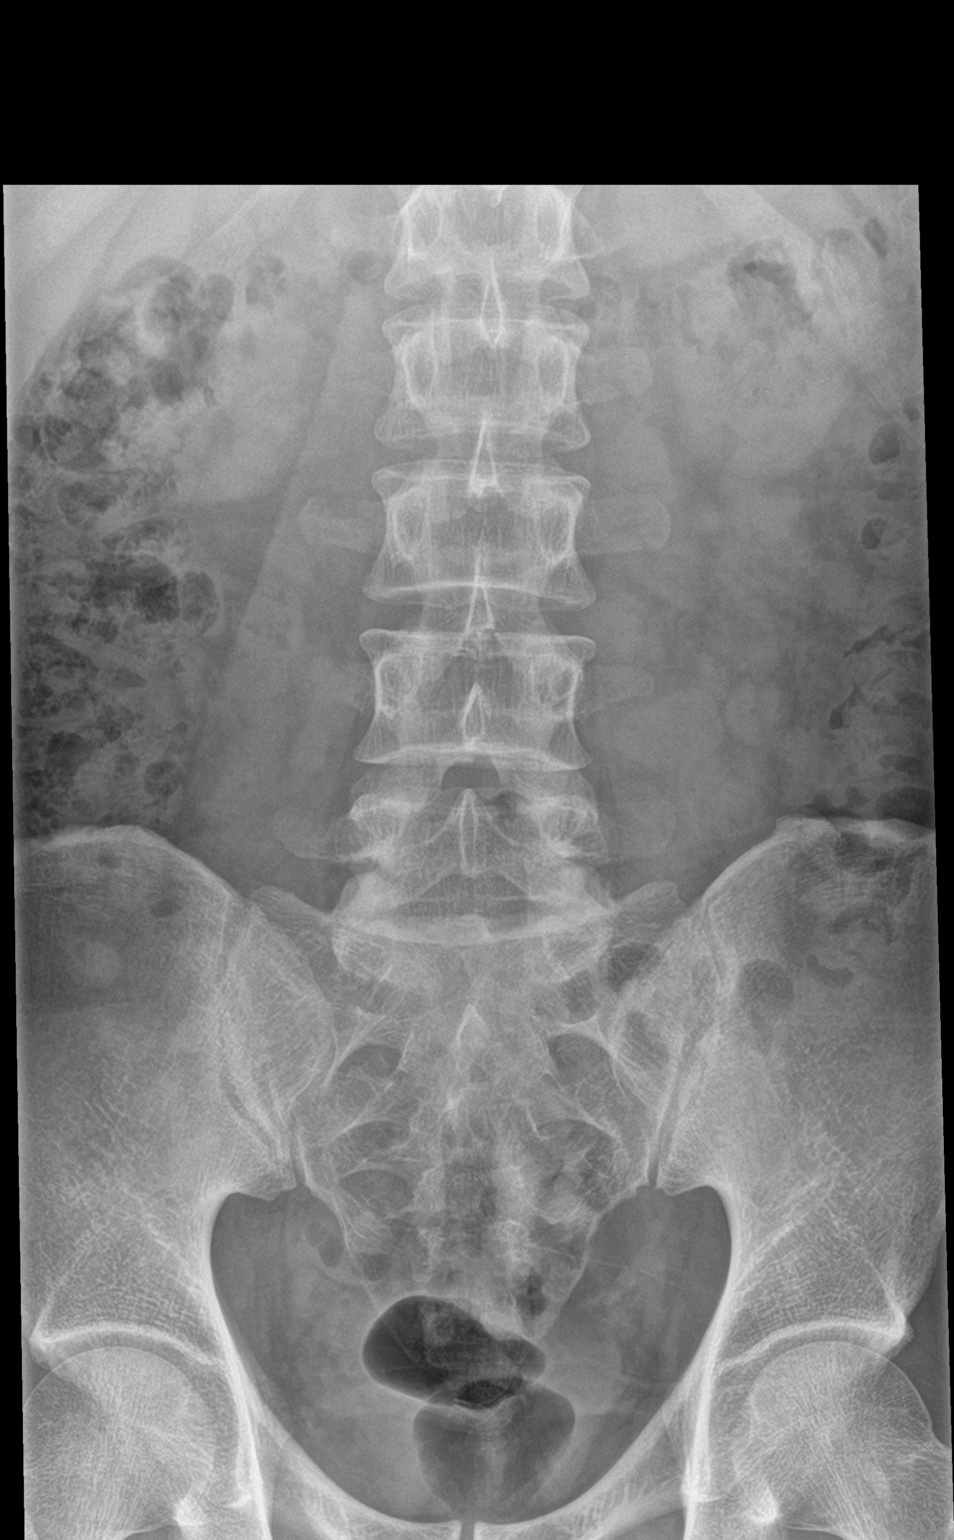

[l-spine lat]
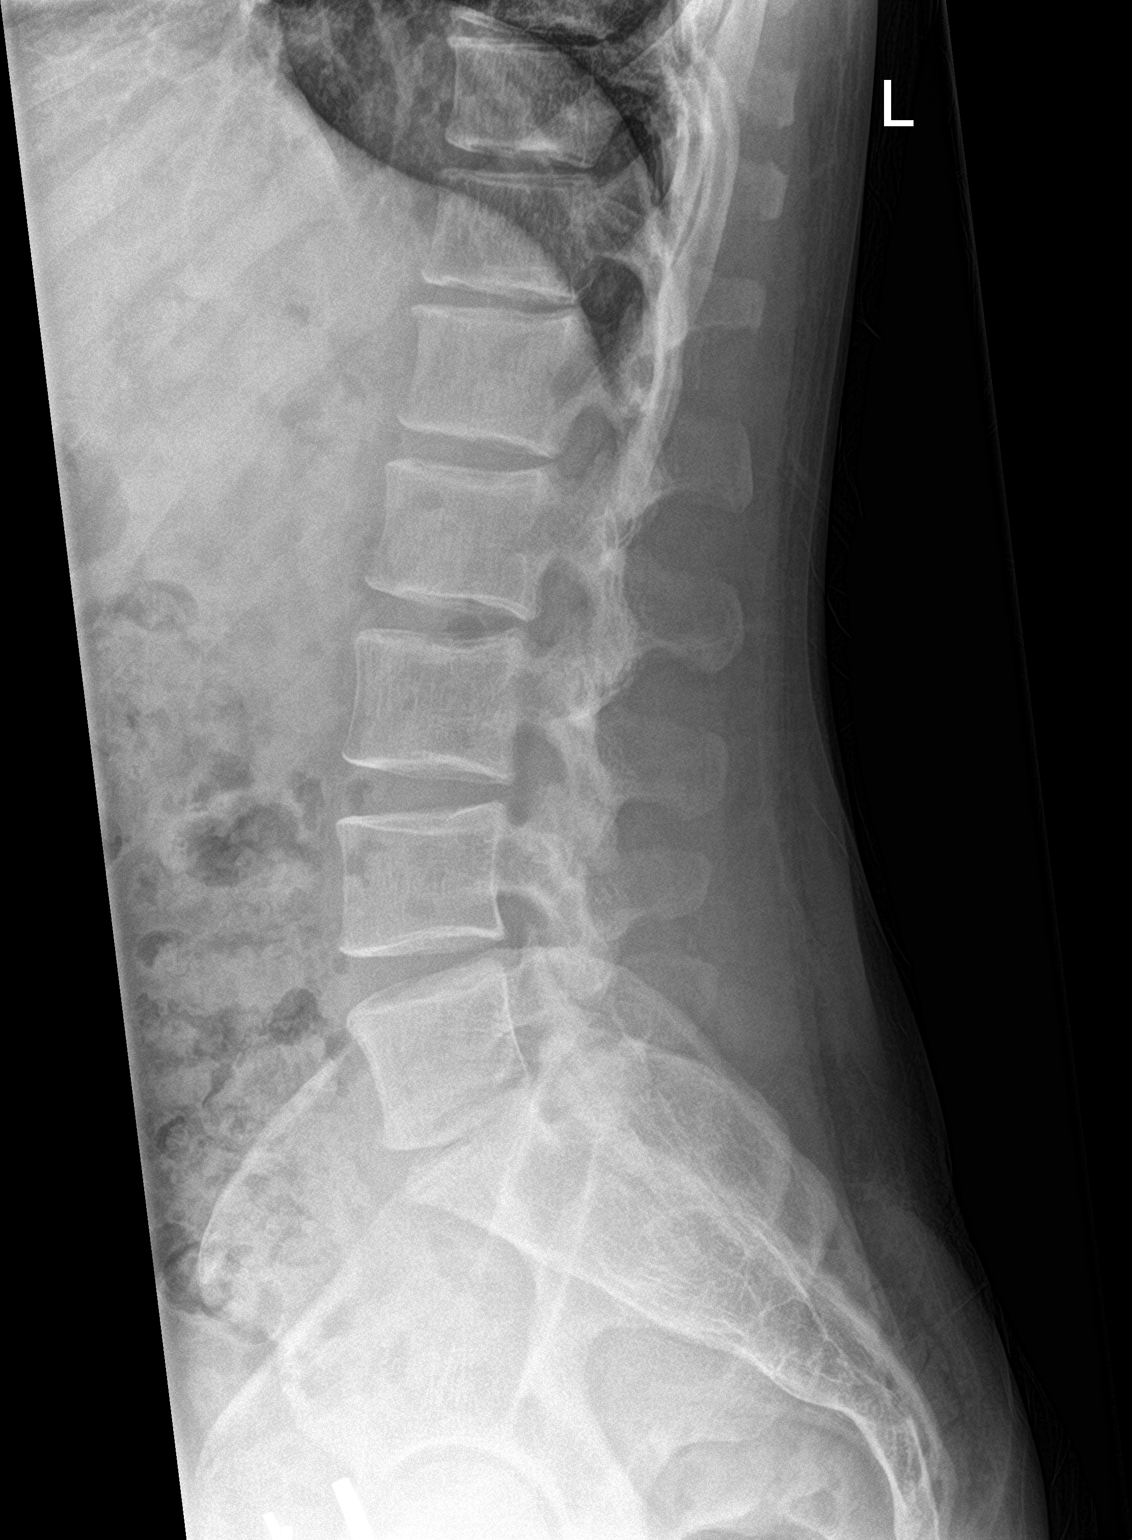

[l-spine spot]
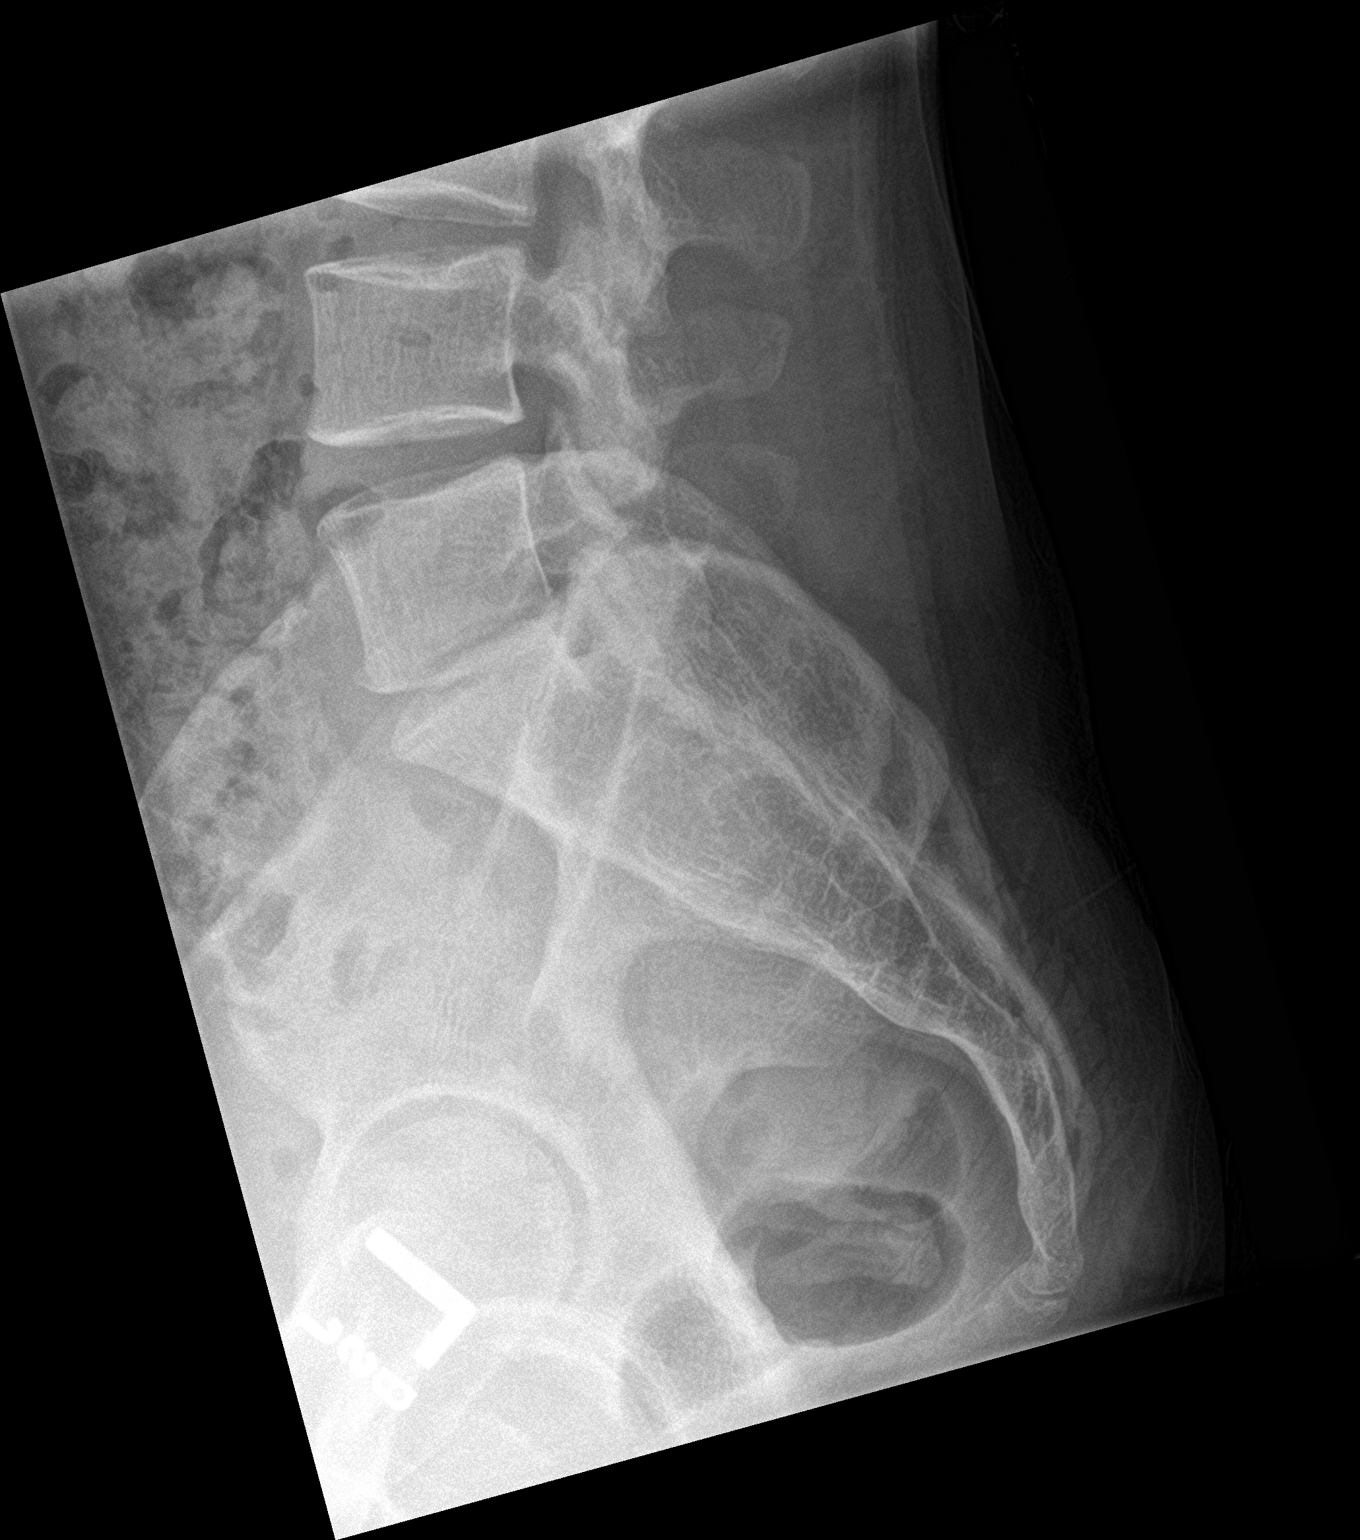

[3 of 3 positions shown; findings below may reference images not displayed]

FINDINGS: There is no evidence of lumbar spine fracture. Alignment is normal.
Mild degenerative disc disease is noted at L5-S1.
IMPRESSION: Mild degenerative disc disease is noted at L5-S1. No acute
abnormality is seen.

## 2023-07-17 ENCOUNTER — Telehealth: Payer: Self-pay | Admitting: Adult Health

## 2023-07-17 NOTE — Progress Notes (Deleted)
CC:  headaches  Follow-up Visit  Last visit: 07/17/2022  Brief HPI: 38 year old male who follows in clinic for daily tension-type headaches. Brain MRI 09/19/21 was unremarkable.  At his last visit, continued on amitriptyline 25 mg nightly for prevention and diclofenac and Robaxin as needed   Interval History:  Doing well since prior visit.  Has been experiencing about 3 headaches per month.  Did take diclofenac for more severe headache with benefit but with milder headaches will do neck exercises with benefit.  Continues on amitriptyline 25 mg nightly, denies side effects.  No questions or concerns today.    Headache days per month: 3 Headache free days per month: 27  Current Headache Regimen: Preventative: amitriptyline 25 mg QHS Abortive: Diclofenac, Robaxin   Prior Therapies                                  Topamax 50/75 - lack of efficacy Amitriptyline 25 mg QHS Robaxin 500 mg PRN Diclofenac  Excedrin BC powder Neck PT  Current Outpatient Medications on File Prior to Visit  Medication Sig Dispense Refill   amitriptyline (ELAVIL) 25 MG tablet Take 1 tablet (25 mg total) by mouth at bedtime. 90 tablet 3   pantoprazole (PROTONIX) 40 MG tablet Take 1 tablet (40 mg total) by mouth 2 (two) times daily. 60 tablet 11   polyvinyl alcohol (LIQUIFILM TEARS) 1.4 % ophthalmic solution Place 1 drop into both eyes as needed for dry eyes.     No current facility-administered medications on file prior to visit.   Past Medical History:  Diagnosis Date   Complication of anesthesia    GERD (gastroesophageal reflux disease)    Headache    PONV (postoperative nausea and vomiting)    Past Surgical History:  Procedure Laterality Date   arm surgery Left    BALLOON DILATION N/A 03/28/2023   Procedure: BALLOON DILATION;  Surgeon: Lanelle Bal, DO;  [facility]: [facility];  Service: Endoscopy;  Laterality: N/A;   BIOPSY  03/28/2023   Procedure: BIOPSY;  Surgeon: Lanelle Bal, DO;  [facility];  Service: Endoscopy;;   ESOPHAGOGASTRODUODENOSCOPY (EGD) WITH PROPOFOL N/A 03/28/2023   Procedure: ESOPHAGOGASTRODUODENOSCOPY (EGD) WITH PROPOFOL;  Surgeon: Lanelle Bal, DO;  [facility]: [facility];  Service: Endoscopy;  Laterality: N/A;  200pm, asa 1   KNEE ARTHROSCOPY Left 02/20/2022   Procedure: ARTHROSCOPY KNEE;  Surgeon: Vickki Hearing, MD;  Location: [facility];  Service: Orthopedics;  Laterality: Left;   TESTICLE TORSION REDUCTION         Physical Exam:   Vital Signs: There were no vitals taken for this visit. GENERAL:  well appearing, in no acute distress, alert  SKIN:  Color, texture, turgor normal. No rashes or lesions HEAD:  Normocephalic/atraumatic. RESP: normal respiratory effort MSK:  No gross joint deformities.   NEUROLOGICAL: Mental Status: Alert, oriented to person, place and time, Follows commands, and Speech fluent and appropriate. Cranial Nerves: PERRL, face symmetric, no dysarthria, hearing grossly intact Motor: moves all extremities equally Gait: normal-based.     Latest Ref Rng & Units 02/16/2022    9:27 AM  BMP  Glucose 70 - 99 mg/dL 80   BUN 6 - 20 mg/dL 18   Creatinine 4.09 - 1.24 mg/dL 8.11   Sodium 914 - 782 mmol/L 140   Potassium 3.5 - 5.1 mmol/L 3.9   Chloride 98 - 111  mmol/L 108   CO2 22 - 32 mmol/L 26   Calcium 8.9 - 10.3 mg/dL 8.9       Latest Ref Rng & Units 02/16/2022    9:27 AM  CBC  WBC 4.0 - 10.5 K/uL 5.2   Hemoglobin 13.0 - 17.0 g/dL 16.1   Hematocrit 09.6 - 52.0 % 44.1   Platelets 150 - 400 K/uL 239      IMPRESSION: 38 year old male who presents for follow up of chronic tension-type headaches. He has had significant improvement with physical therapy and amitriptyline. Will continue amitriptyline and diclofenac to take for rescue   PLAN: -Prevention: continue amitriptyline 25 mg QHS -Rescue: Continue diclofenac 50-100 mg PRN, continue robaxin 500 mg PRN   Follow-up in 1 year or call  earlier if needed    I spent 15 minutes of face-to-face and non-face-to-face time with patient.  This included previsit chart review, lab review, study review, order entry, electronic health record documentation, patient education and discussion regarding the above and answered all the questions to patient's satisfaction  Ihor Austin, [facility]  [facility] [address] [place], [place] [address]  Phone [phone] Fax [phone] Note: This document was prepared with digital dictation and possible smart phrase technology. Any transcriptional errors that result from this process are unintentional.

## 2023-07-17 NOTE — Telephone Encounter (Signed)
 Pt LVM at 9:38 am to reschedule appointment.  Contacted to patient to reschedule on 01/20/24 at 1:45 pm

## 2023-07-18 ENCOUNTER — Ambulatory Visit: Payer: 59 | Admitting: Adult Health

## 2023-08-02 ENCOUNTER — Other Ambulatory Visit: Payer: Self-pay | Admitting: Adult Health

## 2023-08-05 NOTE — Telephone Encounter (Signed)
Last seen on 07/17/22 Follow up scheduled on 01/20/24

## 2023-11-10 ENCOUNTER — Other Ambulatory Visit: Payer: Self-pay | Admitting: Adult Health

## 2023-11-13 ENCOUNTER — Telehealth: Payer: Self-pay | Admitting: Adult Health

## 2023-11-13 MED ORDER — AMITRIPTYLINE HCL 25 MG PO TABS: 25.0000 mg | ORAL_TABLET | Freq: Every day | ORAL | 0 refills | Status: DC

## 2023-11-13 NOTE — Telephone Encounter (Signed)
 Rx was sent

## 2023-11-13 NOTE — Telephone Encounter (Signed)
 Pt called wanting to know when this will be filled for him. Please advise.

## 2024-01-20 ENCOUNTER — Ambulatory Visit: Payer: 59 | Admitting: Adult Health

## 2024-02-07 ENCOUNTER — Other Ambulatory Visit: Payer: Self-pay | Admitting: Adult Health

## 2024-02-13 ENCOUNTER — Encounter: Payer: Self-pay | Admitting: Adult Health

## 2024-02-13 MED ORDER — AMITRIPTYLINE HCL 25 MG PO TABS: 25.0000 mg | ORAL_TABLET | Freq: Every day | ORAL | 0 refills | Status: DC

## 2024-02-21 ENCOUNTER — Encounter: Payer: Self-pay | Admitting: Radiology

## 2024-02-26 NOTE — Progress Notes (Unsigned)
 CC:  headaches  Follow-up Visit  Last visit: 07/17/2022 Brief HPI: 38 year old male who follows in clinic for daily tension-type headaches. Brain MRI 09/19/21 was unremarkable.  At his last visit, he continued on amitriptyline  for preventative and continued on diclofenac  and Robaxin  for rescue   Interval History:  Returns today for follow-up visit via MyChart video visit after prior visit over 1.5 years ago.  Headaches have remained well-controlled on amitriptyline  25 mg nightly.   Use of diclofenac  ***   Doing well since prior visit.  Has been experiencing about 3 headaches per month.  Did take diclofenac  for more severe headache with benefit but with milder headaches will do neck exercises with benefit.  Continues on amitriptyline  25 mg nightly, denies side effects.  No questions or concerns today.    Headache days per month: 3 Headache free days per month: 27  Current Headache Regimen: Preventative: amitriptyline  25 mg QHS Abortive: Diclofenac , Robaxin    Prior Therapies                                  Topamax  50/75 - lack of efficacy Amitriptyline  25 mg QHS Robaxin  500 mg PRN Diclofenac   Excedrin BC powder Neck PT  Current Outpatient Medications on File Prior to Visit  Medication Sig Dispense Refill   amitriptyline  (ELAVIL ) 25 MG tablet Take 1 tablet (25 mg total) by mouth at bedtime. 30 tablet 0   pantoprazole  (PROTONIX ) 40 MG tablet Take 1 tablet (40 mg total) by mouth 2 (two) times daily. 60 tablet 11   polyvinyl alcohol (LIQUIFILM TEARS) 1.4 % ophthalmic solution Place 1 drop into both eyes as needed for dry eyes.     No current facility-administered medications on file prior to visit.   Past Medical History:  Diagnosis Date   Complication of anesthesia    GERD (gastroesophageal reflux disease)    Headache    PONV (postoperative nausea and vomiting)    Past Surgical History:  Procedure Laterality Date   arm surgery Left    BALLOON DILATION N/A  03/28/2023   Procedure: BALLOON DILATION;  Surgeon: Cindie Carlin POUR, DO;  [facility]: [facility];  Service: Endoscopy;  Laterality: N/A;   BIOPSY  03/28/2023   Procedure: BIOPSY;  Surgeon: Cindie Carlin POUR, DO;  [facility]: [facility];  Service: Endoscopy;;   ESOPHAGOGASTRODUODENOSCOPY (EGD) WITH PROPOFOL  N/A 03/28/2023   Procedure: ESOPHAGOGASTRODUODENOSCOPY (EGD) WITH PROPOFOL ;  Surgeon: Cindie Carlin POUR, DO;  [facility]: [facility];  Service: Endoscopy;  Laterality: N/A;  200pm, asa 1   KNEE ARTHROSCOPY Left 02/20/2022   Procedure: ARTHROSCOPY KNEE;  Surgeon: Margrette Taft BRAVO, MD;  Location: [facility];  Service: Orthopedics;  Laterality: Left;   TESTICLE TORSION REDUCTION         Physical Exam:   Vital Signs: There were no vitals taken for this visit. GENERAL:  well appearing, in no acute distress, alert  SKIN:  Color, texture, turgor normal. No rashes or lesions HEAD:  Normocephalic/atraumatic. RESP: normal respiratory effort MSK:  No gross joint deformities.   NEUROLOGICAL: Mental Status: Alert, oriented to person, place and time, Follows commands, and Speech fluent and appropriate. Cranial Nerves: PERRL, face symmetric, no dysarthria, hearing grossly intact Motor: moves all extremities equally Gait: normal-based.     Latest Ref Rng & Units 02/16/2022    9:27 AM  BMP  Glucose 70 - 99 mg/dL 80   BUN 6 -  20 mg/dL 18   Creatinine 9.38 - 1.24 mg/dL 8.88   Sodium 864 - 854 mmol/L 140   Potassium 3.5 - 5.1 mmol/L 3.9   Chloride 98 - 111 mmol/L 108   CO2 22 - 32 mmol/L 26   Calcium 8.9 - 10.3 mg/dL 8.9       Latest Ref Rng & Units 02/16/2022    9:27 AM  CBC  WBC 4.0 - 10.5 K/uL 5.2   Hemoglobin 13.0 - 17.0 g/dL 83.9   Hematocrit 60.9 - 52.0 % 44.1   Platelets 150 - 400 K/uL 239      IMPRESSION: 38 year old male who presents for follow up of chronic tension-type headaches. He has had significant improvement with physical therapy and amitriptyline . Will continue  amitriptyline  and diclofenac  to take for rescue   PLAN: -Prevention: continue amitriptyline  25 mg QHS -Rescue: Continue diclofenac  50-100 mg PRN, continue robaxin  500 mg PRN   Follow-up in 1 year or call earlier if needed    I personally spent a total of *** minutes in the care of the patient today including {Time Based Coding:210964241}.   Harlene Bogaert, AGNP-BC  [facility] [address] [place], [place] [address]  Phone [phone] Fax [phone] Note: This document was prepared with digital dictation and possible smart phrase technology. Any transcriptional errors that result from this process are unintentional.

## 2024-02-27 ENCOUNTER — Encounter: Payer: Self-pay | Admitting: Adult Health

## 2024-02-27 ENCOUNTER — Telehealth (INDEPENDENT_AMBULATORY_CARE_PROVIDER_SITE_OTHER): Payer: Self-pay | Admitting: Adult Health

## 2024-02-27 DIAGNOSIS — G44229 Chronic tension-type headache, not intractable: Secondary | ICD-10-CM

## 2024-02-27 MED ORDER — DICLOFENAC POTASSIUM 50 MG PO TABS
50.0000 mg | ORAL_TABLET | ORAL | 11 refills | Status: AC | PRN
Start: 2024-02-27 — End: ?

## 2024-02-27 MED ORDER — AMITRIPTYLINE HCL 25 MG PO TABS: 25.0000 mg | ORAL_TABLET | Freq: Every day | ORAL | 3 refills | Status: AC

## 2024-04-15 DIAGNOSIS — Z6824 Body mass index (BMI) 24.0-24.9, adult: Secondary | ICD-10-CM | POA: Diagnosis not present

## 2024-04-15 DIAGNOSIS — Z7689 Persons encountering health services in other specified circumstances: Secondary | ICD-10-CM | POA: Diagnosis not present

## 2024-04-15 DIAGNOSIS — G43911 Migraine, unspecified, intractable, with status migrainosus: Secondary | ICD-10-CM | POA: Diagnosis not present

## 2024-04-15 DIAGNOSIS — Z8249 Family history of ischemic heart disease and other diseases of the circulatory system: Secondary | ICD-10-CM | POA: Diagnosis not present

## 2024-05-04 ENCOUNTER — Encounter: Payer: Self-pay | Admitting: Radiology

## 2024-05-13 DIAGNOSIS — Z6824 Body mass index (BMI) 24.0-24.9, adult: Secondary | ICD-10-CM | POA: Diagnosis not present

## 2024-05-19 DIAGNOSIS — Z8249 Family history of ischemic heart disease and other diseases of the circulatory system: Secondary | ICD-10-CM | POA: Diagnosis not present

## 2024-05-19 DIAGNOSIS — Z0000 Encounter for general adult medical examination without abnormal findings: Secondary | ICD-10-CM | POA: Diagnosis not present

## 2024-05-19 DIAGNOSIS — G43911 Migraine, unspecified, intractable, with status migrainosus: Secondary | ICD-10-CM | POA: Diagnosis not present

## 2024-06-16 DIAGNOSIS — M7918 Myalgia, other site: Secondary | ICD-10-CM | POA: Diagnosis not present

## 2024-06-16 DIAGNOSIS — R253 Fasciculation: Secondary | ICD-10-CM | POA: Diagnosis not present

## 2024-06-19 ENCOUNTER — Other Ambulatory Visit: Payer: Self-pay | Admitting: Internal Medicine

## 2024-06-22 NOTE — Telephone Encounter (Signed)
 Overdue for an ov. Limited refills provided (3 month supply)

## 2024-06-23 DIAGNOSIS — M7918 Myalgia, other site: Secondary | ICD-10-CM | POA: Diagnosis not present

## 2024-06-23 DIAGNOSIS — R7989 Other specified abnormal findings of blood chemistry: Secondary | ICD-10-CM | POA: Diagnosis not present

## 2024-07-21 ENCOUNTER — Ambulatory Visit: Payer: Self-pay | Admitting: Adult Health

## 2025-03-04 ENCOUNTER — Telehealth: Payer: Self-pay | Admitting: Adult Health
